# Patient Record
Sex: Female | Born: 1970
Health system: Southern US, Community
[De-identification: ages and names within clinical notes are randomized; demographics above are authoritative.]

## PROBLEM LIST (undated history)

## (undated) ENCOUNTER — Encounter

## (undated) ENCOUNTER — Ambulatory Visit

## (undated) ENCOUNTER — Encounter: Attending: Obstetrics & Gynecology | Primary: Obstetrics & Gynecology

## (undated) ENCOUNTER — Ambulatory Visit: Attending: Obstetrics & Gynecology | Primary: Obstetrics & Gynecology

## (undated) ENCOUNTER — Telehealth

## (undated) ENCOUNTER — Ambulatory Visit: Payer: PRIVATE HEALTH INSURANCE

## (undated) ENCOUNTER — Ambulatory Visit: Payer: PRIVATE HEALTH INSURANCE | Attending: Obstetrics & Gynecology | Primary: Obstetrics & Gynecology

## (undated) DIAGNOSIS — I00 Rheumatic fever without heart involvement: Secondary | ICD-10-CM

## (undated) DIAGNOSIS — I499 Cardiac arrhythmia, unspecified: Secondary | ICD-10-CM

## (undated) DIAGNOSIS — F909 Attention-deficit hyperactivity disorder, unspecified type: Secondary | ICD-10-CM

## (undated) DIAGNOSIS — Z87440 Personal history of urinary (tract) infections: Secondary | ICD-10-CM

## (undated) DIAGNOSIS — M069 Rheumatoid arthritis, unspecified: Secondary | ICD-10-CM

## (undated) DIAGNOSIS — K219 Gastro-esophageal reflux disease without esophagitis: Secondary | ICD-10-CM

## (undated) DIAGNOSIS — Z87411 Personal history of vaginal dysplasia: Secondary | ICD-10-CM

## (undated) DIAGNOSIS — Z8679 Personal history of other diseases of the circulatory system: Secondary | ICD-10-CM

## (undated) DIAGNOSIS — G8929 Other chronic pain: Secondary | ICD-10-CM

## (undated) DIAGNOSIS — N903 Dysplasia of vulva, unspecified: Secondary | ICD-10-CM

## (undated) DIAGNOSIS — R002 Palpitations: Secondary | ICD-10-CM

## (undated) DIAGNOSIS — Z8619 Personal history of other infectious and parasitic diseases: Secondary | ICD-10-CM

## (undated) DIAGNOSIS — M545 Low back pain, unspecified: Secondary | ICD-10-CM

## (undated) DIAGNOSIS — Z87410 Personal history of cervical dysplasia: Secondary | ICD-10-CM

## (undated) DIAGNOSIS — N390 Urinary tract infection, site not specified: Secondary | ICD-10-CM

## (undated) DIAGNOSIS — I10 Essential (primary) hypertension: Secondary | ICD-10-CM

## (undated) DIAGNOSIS — R072 Precordial pain: Secondary | ICD-10-CM

## (undated) DIAGNOSIS — F329 Major depressive disorder, single episode, unspecified: Secondary | ICD-10-CM

## (undated) DIAGNOSIS — Z8719 Personal history of other diseases of the digestive system: Secondary | ICD-10-CM

## (undated) HISTORY — PX: TUBAL LIGATION: SHX77

## (undated) HISTORY — PX: PLACEMENT OF BREAST IMPLANTS: SHX6334

## (undated) HISTORY — DX: Rheumatoid arthritis, unspecified: M06.9

## (undated) HISTORY — DX: Cardiac arrhythmia, unspecified: I49.9

## (undated) HISTORY — PX: DILATION AND CURETTAGE OF UTERUS: SHX78

## (undated) HISTORY — DX: Personal history of urinary (tract) infections: Z87.440

## (undated) HISTORY — DX: Essential (primary) hypertension: I10

## (undated) HISTORY — PX: APPENDECTOMY: SHX54

## (undated) MED ORDER — LEVONORGESTREL 20 MCG/24 HOURS (6 YRS) 52 MG INTRAUTERINE DEVICE: Freq: Once | INTRAUTERINE | 0 days

## (undated) MED ORDER — BARICITINIB 2 MG TABLET: Freq: Every day | ORAL | 0.00000 days

---

## 2001-09-26 HISTORY — PX: CERVICAL BIOPSY  W/ LOOP ELECTRODE EXCISION: SUR135

## 2003-04-19 ENCOUNTER — Emergency Department (HOSPITAL_COMMUNITY): Admission: EM | Admit: 2003-04-19 | Discharge: 2003-04-19 | Payer: Self-pay | Admitting: Emergency Medicine

## 2004-10-23 ENCOUNTER — Emergency Department (HOSPITAL_COMMUNITY): Admission: EM | Admit: 2004-10-23 | Discharge: 2004-10-23 | Payer: Self-pay | Admitting: Emergency Medicine

## 2006-07-05 ENCOUNTER — Emergency Department (HOSPITAL_COMMUNITY): Admission: EM | Admit: 2006-07-05 | Discharge: 2006-07-06 | Payer: Self-pay | Admitting: Emergency Medicine

## 2007-01-19 ENCOUNTER — Ambulatory Visit (HOSPITAL_COMMUNITY): Admission: RE | Admit: 2007-01-19 | Discharge: 2007-01-19 | Payer: Self-pay | Admitting: Interventional Radiology

## 2009-11-13 ENCOUNTER — Emergency Department (HOSPITAL_COMMUNITY): Admission: EM | Admit: 2009-11-13 | Discharge: 2009-11-13 | Payer: Self-pay | Admitting: Emergency Medicine

## 2010-12-16 LAB — URINALYSIS, ROUTINE W REFLEX MICROSCOPIC
Nitrite: NEGATIVE
Protein, ur: NEGATIVE mg/dL
Specific Gravity, Urine: 1.013 (ref 1.005–1.030)
Urobilinogen, UA: 0.2 mg/dL (ref 0.0–1.0)

## 2010-12-16 LAB — URINE CULTURE: Colony Count: 8000

## 2010-12-16 LAB — URINE MICROSCOPIC-ADD ON

## 2010-12-17 ENCOUNTER — Other Ambulatory Visit: Payer: Self-pay | Admitting: Family Medicine

## 2010-12-17 DIAGNOSIS — M25561 Pain in right knee: Secondary | ICD-10-CM

## 2010-12-20 ENCOUNTER — Ambulatory Visit
Admission: RE | Admit: 2010-12-20 | Discharge: 2010-12-20 | Disposition: A | Discharge status: Home / Self Care | Payer: Commercial Managed Care - PPO | Source: Ambulatory Visit | Attending: Family Medicine | Admitting: Family Medicine

## 2010-12-20 DIAGNOSIS — M25561 Pain in right knee: Secondary | ICD-10-CM

## 2011-02-22 ENCOUNTER — Other Ambulatory Visit (HOSPITAL_BASED_OUTPATIENT_CLINIC_OR_DEPARTMENT_OTHER): Payer: Self-pay | Admitting: Internal Medicine

## 2011-02-22 ENCOUNTER — Ambulatory Visit (HOSPITAL_BASED_OUTPATIENT_CLINIC_OR_DEPARTMENT_OTHER)
Admission: RE | Admit: 2011-02-22 | Discharge: 2011-02-22 | Disposition: A | Discharge status: Home / Self Care | Payer: 59 | Source: Ambulatory Visit | Attending: Internal Medicine | Admitting: Internal Medicine

## 2011-02-22 DIAGNOSIS — R509 Fever, unspecified: Secondary | ICD-10-CM | POA: Insufficient documentation

## 2011-02-22 DIAGNOSIS — R0602 Shortness of breath: Secondary | ICD-10-CM

## 2011-02-22 DIAGNOSIS — R0989 Other specified symptoms and signs involving the circulatory and respiratory systems: Secondary | ICD-10-CM | POA: Insufficient documentation

## 2011-09-27 HISTORY — PX: PLACEMENT OF BREAST IMPLANTS: SHX6334

## 2011-10-04 ENCOUNTER — Ambulatory Visit (HOSPITAL_BASED_OUTPATIENT_CLINIC_OR_DEPARTMENT_OTHER)
Admission: RE | Admit: 2011-10-04 | Discharge: 2011-10-04 | Disposition: A | Discharge status: Home / Self Care | Payer: 59 | Source: Ambulatory Visit | Attending: Internal Medicine | Admitting: Internal Medicine

## 2011-10-04 ENCOUNTER — Other Ambulatory Visit (HOSPITAL_BASED_OUTPATIENT_CLINIC_OR_DEPARTMENT_OTHER): Payer: Self-pay | Admitting: Internal Medicine

## 2011-10-04 DIAGNOSIS — R059 Cough, unspecified: Secondary | ICD-10-CM

## 2011-10-04 DIAGNOSIS — R0602 Shortness of breath: Secondary | ICD-10-CM | POA: Insufficient documentation

## 2011-10-04 DIAGNOSIS — R05 Cough: Secondary | ICD-10-CM

## 2011-10-04 DIAGNOSIS — R0989 Other specified symptoms and signs involving the circulatory and respiratory systems: Secondary | ICD-10-CM | POA: Insufficient documentation

## 2012-03-05 ENCOUNTER — Encounter (HOSPITAL_BASED_OUTPATIENT_CLINIC_OR_DEPARTMENT_OTHER): Payer: Self-pay | Admitting: *Deleted

## 2012-03-05 ENCOUNTER — Emergency Department (HOSPITAL_BASED_OUTPATIENT_CLINIC_OR_DEPARTMENT_OTHER)
Admission: EM | Admit: 2012-03-05 | Discharge: 2012-03-05 | Disposition: A | Discharge status: Home / Self Care | Payer: 59 | Attending: Emergency Medicine | Admitting: Emergency Medicine

## 2012-03-05 DIAGNOSIS — Z880 Allergy status to penicillin: Secondary | ICD-10-CM | POA: Insufficient documentation

## 2012-03-05 DIAGNOSIS — N390 Urinary tract infection, site not specified: Secondary | ICD-10-CM | POA: Insufficient documentation

## 2012-03-05 DIAGNOSIS — F17200 Nicotine dependence, unspecified, uncomplicated: Secondary | ICD-10-CM | POA: Insufficient documentation

## 2012-03-05 HISTORY — DX: Urinary tract infection, site not specified: N39.0

## 2012-03-05 HISTORY — DX: Rheumatic fever without heart involvement: I00

## 2012-03-05 LAB — URINE MICROSCOPIC-ADD ON

## 2012-03-05 LAB — URINALYSIS, ROUTINE W REFLEX MICROSCOPIC
Nitrite: NEGATIVE
Protein, ur: NEGATIVE mg/dL
Specific Gravity, Urine: 1.025 (ref 1.005–1.030)
Urobilinogen, UA: 1 mg/dL (ref 0.0–1.0)

## 2012-03-05 MED ORDER — PHENAZOPYRIDINE HCL 200 MG PO TABS: 200.0000 mg | ORAL_TABLET | Freq: Three times a day (TID) | ORAL | Status: AC

## 2012-03-05 MED ORDER — ONDANSETRON 8 MG PO TBDP: 8.0000 mg | ORAL_TABLET | Freq: Three times a day (TID) | ORAL | Status: AC | PRN

## 2012-03-05 MED ORDER — NITROFURANTOIN MONOHYD MACRO 100 MG PO CAPS: 100.0000 mg | ORAL_CAPSULE | Freq: Two times a day (BID) | ORAL | Status: AC

## 2012-03-05 NOTE — ED Notes (Signed)
Dysuria, frequency, back pain, low grade fever x 2 days. Hx of UTI's.

## 2012-03-05 NOTE — ED Provider Notes (Signed)
History     CSN: [id number]  Arrival date & time 03/05/12  1352   First MD Initiated Contact with Patient 03/05/12 1408      Chief Complaint  Patient presents with  . Urinary Tract Infection    (Consider location/radiation/quality/duration/timing/severity/associated sxs/prior treatment) HPI Comments: Patient presents with her usual urinary tract infection symptoms for 7 days consisting of dysuria , low-grade fever to 101F, lower back pain that is worse on the right, increased frequency. Patient has had nausea but no vomiting. She otherwise denies abdominal pain. The treatments prior to arrival. Course is constant, onset was gradual. Nothing makes symptoms better or worse.  Patient is a 41 y.o. female presenting with dysuria. The history is provided by the patient.  Dysuria  This is a new problem. The current episode started more than 2 days ago. The problem occurs every urination. The problem has not changed since onset.The quality of the pain is described as burning. The pain is mild. The maximum temperature recorded prior to her arrival was 101 to 101.9 F. Associated symptoms include nausea, frequency, hematuria and urgency. Pertinent negatives include no vomiting and no flank pain. She has tried nothing for the symptoms. Her past medical history is significant for recurrent UTIs.    Past Medical History  Diagnosis Date  . UTI (lower urinary tract infection)   . Rheumatic fever     Past Surgical History  Procedure Date  . Appendectomy   . Tubal ligation     No family history on file.  History  Substance Use Topics  . Smoking status: Current Everyday Smoker -- 1.0 packs/day  . Smokeless tobacco: Not on file  . Alcohol Use: Yes    OB History    Grav Para Term Preterm Abortions TAB SAB Ect Mult Living                  Review of Systems  Constitutional: Negative for fever.  HENT: Negative for facial swelling.   Gastrointestinal: Positive for nausea. Negative for  vomiting, abdominal pain and diarrhea.  Genitourinary: Positive for dysuria, urgency, frequency and hematuria. Negative for flank pain.    Allergies  Penicillins  Home Medications   Current Outpatient Rx  Name Route Sig Dispense Refill  . ADDERALL PO Oral Take by mouth.    Marland Kitchen HYDROCODONE-ACETAMINOPHEN 10-325 MG PO TABS Oral Take 1 tablet by mouth every 6 (six) hours as needed.      BP 123/82  Pulse 98  Temp(Src) 97.9 F (36.6 C) (Oral)  Resp 20  SpO2 99%  Physical Exam  Nursing note and vitals reviewed. Constitutional: She appears well-developed and well-nourished.  HENT:  Head: Normocephalic and atraumatic.  Eyes: Conjunctivae are normal.  Neck: Normal range of motion. Neck supple.  Cardiovascular: Normal rate and normal heart sounds.   Pulmonary/Chest: Effort normal and breath sounds normal. No respiratory distress. She has no wheezes.  Abdominal: There is tenderness. There is CVA tenderness (mild, R>>L). There is no rigidity, no rebound, no guarding and negative Murphy's sign.  Neurological: She is alert.  Skin: Skin is warm and dry.  Psychiatric: She has a normal mood and affect.    ED Course  Procedures (including critical care time)  Labs Reviewed  URINALYSIS, ROUTINE W REFLEX MICROSCOPIC - Abnormal; Notable for the following:    Color, Urine AMBER (*) BIOCHEMICALS MAY BE AFFECTED BY COLOR   APPearance CLOUDY (*)    Hgb urine dipstick SMALL (*)    Bilirubin Urine SMALL (*)  Ketones, ur 15 (*)    Leukocytes, UA SMALL (*)    All other components within normal limits  URINE MICROSCOPIC-ADD ON - Abnormal; Notable for the following:    Squamous Epithelial / LPF FEW (*)    Bacteria, UA MANY (*)    All other components within normal limits  URINE CULTURE   No results found.   1. Urinary tract infection     2:45 PM Patient seen and examined. UA reviewed. Will treat for UTI. Will give zofran for nausea. Urged return if no improvement.   Vital signs  reviewed and are as follows: Filed Vitals:   03/05/12 1356  BP: 123/82  Pulse: 98  Temp: 97.9 F (36.6 C)  Resp: 20   Patient urged to return to the emergency department with persistent fever, persistent vomiting, worsening pain, or any other emergent concerns. Patient verbalizes understanding and agrees with the plan.   MDM  Patient presents with symptoms consistent with previous urinary tract infections. Do not suspect pyelonephritis at this time. Patient is tolerating PO's. UA is suggestive of a urinary tract infection however it is not a clean catch. Given the patient's history of recurrent UTIs will check culture.        Renne Crigler, [place] 03/05/12 1526

## 2012-03-05 NOTE — Discharge Instructions (Signed)
Please read and follow all provided instructions.  Your diagnoses today include:  1. Urinary tract infection     Tests performed today include:  Urine test which suggests an infection in your urine  Vital signs. See below for your results today.   Medications prescribed:   Macrobid - antibiotic that kills urine infection  You have been prescribed an antibiotic medicine: take the entire course of medicine even if you are feeling better. Stopping early can cause the antibiotic not to work.   Zofran (ondansetron) - for nausea and vomiting  Take any prescribed medications only as directed.  Home care instructions:  Follow any educational materials contained in this packet.  Follow-up instructions: Please follow-up with your primary care provider in the next 1 week for further evaluation of your symptoms. If you do not have a primary care doctor -- see below for referral information.   Return instructions:   Please return to the Emergency Department if you experience worsening symptoms.   Please return if you have any other emergent concerns.  Additional Information:  Your vital signs today were: BP 123/82  Pulse 98  Temp(Src) 97.9 F (36.6 C) (Oral)  Resp 20  SpO2 99% If your blood pressure (BP) was elevated above 135/85 this visit, please have this repeated by your doctor within one month. -------------- No Primary Care Doctor Call [facility]  [phone] Other agencies that provide inexpensive medical care    [facility]  [phone]    [facility]  [phone]    [facility]  [phone]    [facility]  [phone]    [facility]  [phone]    [facility]  [phone] -------------- RESOURCE GUIDE:  Dental Problems  Patients with Medicaid: [facility] [phone] [address]                                            [id number] Phone:  [phone]                                                    Phone:  [phone]  If unable to pay or uninsured, contact:  [facility] or [facility]. to become qualified for the adult dental clinic.  Chronic Pain Problems Contact [facility]  [phone] Patients need to be referred by their primary care doctor.  Insufficient Money for Medicine Contact [employer]:  call "211" or [facility] [phone].  Psychological Services [facility]  [phone] [facility]  [phone] [facility]   [phone] (emergency services [phone])  Substance Abuse Resources [facility]  [phone] [facility] [phone] [facility] [phone] Floydene Flock [phone] Residential & Outpatient Substance Abuse Program  [phone]  Abuse/Neglect [facility] Child Abuse Hotline [phone] [facility] Child Abuse Hotline [phone] (After Hours)  Emergency Shelter [facility] [phone]  Maternity Homes [facility] [phone] [facility] Services [phone]  [facility]  [place]     [employer]                          [facility] Dept. [address]                        [address]      [address]  Blondell Reveal Phone:  [phone]                                   Phone:  [phone]                 Phone:  [phone]  [facility] Phone:  [phone]  [facility] Child Abuse Hotline [phone] [phone] (After Hours)

## 2012-03-06 LAB — URINE CULTURE
Colony Count: 30000
Culture  Setup Time: 201306102251

## 2012-03-08 NOTE — ED Provider Notes (Signed)
Medical screening examination/treatment/procedure(s) were performed by non-physician practitioner and as supervising physician I was immediately available for consultation/collaboration.   Nat Christen, MD 03/08/12 1901

## 2012-04-24 ENCOUNTER — Emergency Department (HOSPITAL_COMMUNITY): Payer: PRIVATE HEALTH INSURANCE

## 2012-04-24 ENCOUNTER — Emergency Department (HOSPITAL_COMMUNITY)
Admission: EM | Admit: 2012-04-24 | Discharge: 2012-04-24 | Disposition: A | Discharge status: Home / Self Care | Payer: PRIVATE HEALTH INSURANCE | Attending: Emergency Medicine | Admitting: Emergency Medicine

## 2012-04-24 ENCOUNTER — Encounter (HOSPITAL_COMMUNITY): Payer: Self-pay | Admitting: Emergency Medicine

## 2012-04-24 DIAGNOSIS — Y9210 Unspecified residential institution as the place of occurrence of the external cause: Secondary | ICD-10-CM | POA: Insufficient documentation

## 2012-04-24 DIAGNOSIS — F17200 Nicotine dependence, unspecified, uncomplicated: Secondary | ICD-10-CM | POA: Insufficient documentation

## 2012-04-24 DIAGNOSIS — S63609A Unspecified sprain of unspecified thumb, initial encounter: Secondary | ICD-10-CM

## 2012-04-24 DIAGNOSIS — W230XXA Caught, crushed, jammed, or pinched between moving objects, initial encounter: Secondary | ICD-10-CM | POA: Insufficient documentation

## 2012-04-24 DIAGNOSIS — Z880 Allergy status to penicillin: Secondary | ICD-10-CM | POA: Insufficient documentation

## 2012-04-24 DIAGNOSIS — IMO0002 Reserved for concepts with insufficient information to code with codable children: Secondary | ICD-10-CM | POA: Insufficient documentation

## 2012-04-24 MED ORDER — IBUPROFEN 800 MG PO TABS
800.0000 mg | ORAL_TABLET | Freq: Once | ORAL | Status: AC
  Administered 2012-04-24: 800 mg via ORAL
  Filled 2012-04-24: qty 1

## 2012-04-24 MED ORDER — HYDROCODONE-ACETAMINOPHEN 5-325 MG PO TABS: 2.0000 | ORAL_TABLET | ORAL | Status: AC | PRN

## 2012-04-24 NOTE — ED Provider Notes (Signed)
History     CSN: [id number]  Arrival date & time 04/24/12  0451   First MD Initiated Contact with Patient 04/24/12 [phone]      Chief Complaint  Patient presents with  . Hand Injury  . 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 Pain aggravated by movement. Took nothing for pain PTA. Denies numbness or tingling. Denies additional injury.   Past Medical History  Diagnosis Date  . UTI (lower urinary tract infection)   . Rheumatic fever     Past Surgical History  Procedure Date  . Appendectomy   . Tubal ligation     History reviewed. No pertinent family history.  History  Substance Use Topics  . Smoking status: Current Everyday Smoker -- 1.0 packs/day  . Smokeless tobacco: Not on file  . Alcohol Use: Yes    OB History    Grav Para Term Preterm Abortions TAB SAB Ect Mult Living                  Review of Systems  Musculoskeletal: Positive for arthralgias.  Skin: Negative for color change and wound.  Neurological: Negative for numbness.    Allergies  Penicillins  Home Medications   Current Outpatient Rx  Name Route Sig Dispense Refill  . ADDERALL PO Oral Take 1 tablet by mouth daily.     Marland Kitchen HYDROCODONE-ACETAMINOPHEN 10-325 MG PO TABS Oral Take 1 tablet by mouth every 6 (six) hours as needed. For pain      BP 129/92  Pulse 105  Temp 98 F (36.7 C) (Oral)  Resp 18  SpO2 99%  Physical Exam  Constitutional: She is oriented to person, place, and time. She appears well-developed and well-nourished. No distress.  HENT:  Head: Normocephalic and atraumatic.  Eyes: Conjunctivae are normal.  Cardiovascular: Normal rate and regular  rhythm.   Pulmonary/Chest: Effort normal.  Musculoskeletal: She exhibits edema and tenderness.       Right ankle: She exhibits swelling. tenderness.       Faint bruising with soft tissue swelling and TTP at base of right thumb and into snuff box. FROM of thumb and wrist with pain at snuff box with ROM. Good radial pulse, cap refill and sensation of thumb. No TTP into forearm.    Neurological: She is alert and oriented to person, place, and time.       Normal sensation of entire foot.   Skin: Skin is warm and dry. No rash noted. She is not diaphoretic. No erythema. No pallor.  Psychiatric: She has a normal mood and affect. Her behavior is normal.    ED Course  Procedures (including critical care time)  PO ibuprofen and ice to hand.   Labs Reviewed - No data to display No results found.   1. Sprain of thumb     Xray reviewed by Dr. Norlene Campbell and myself with no obvious boney abnormality.   MDM  Snuff box TTP in neurovasc intact right hand. Will thumb spica and given hand follow up for recheck in one week.         [place], [place] 04/24/12 [phone]

## 2012-04-24 NOTE — ED Provider Notes (Signed)
Medical screening examination/treatment/procedure(s) were performed by non-physician practitioner and as supervising physician I was immediately available for consultation/collaboration.  Olivia Mackie, MD 04/24/12 [phone]

## 2012-04-24 NOTE — ED Notes (Signed)
Ortho paged. 

## 2012-04-24 NOTE — ED Notes (Signed)
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

## 2012-04-24 NOTE — ED Notes (Signed)
Pt back from X-ray.  Post R hand with bruising.  No deformity, though mild edema.  Pt states pain increases with opening and closing of hand.  Ice given to pt.

## 2012-08-21 ENCOUNTER — Other Ambulatory Visit (HOSPITAL_COMMUNITY)
Admission: RE | Admit: 2012-08-21 | Discharge: 2012-08-21 | Disposition: A | Discharge status: Home / Self Care | Payer: 59 | Source: Ambulatory Visit | Attending: Obstetrics and Gynecology | Admitting: Obstetrics and Gynecology

## 2012-08-21 DIAGNOSIS — Z124 Encounter for screening for malignant neoplasm of cervix: Secondary | ICD-10-CM | POA: Insufficient documentation

## 2012-12-21 ENCOUNTER — Encounter (HOSPITAL_COMMUNITY): Payer: Self-pay | Admitting: *Deleted

## 2012-12-21 ENCOUNTER — Emergency Department (HOSPITAL_COMMUNITY)
Admission: EM | Admit: 2012-12-21 | Discharge: 2012-12-21 | Disposition: A | Discharge status: Home / Self Care | Payer: 59 | Attending: Emergency Medicine | Admitting: Emergency Medicine

## 2012-12-21 DIAGNOSIS — R1110 Vomiting, unspecified: Secondary | ICD-10-CM | POA: Insufficient documentation

## 2012-12-21 DIAGNOSIS — R6883 Chills (without fever): Secondary | ICD-10-CM | POA: Insufficient documentation

## 2012-12-21 DIAGNOSIS — R300 Dysuria: Secondary | ICD-10-CM | POA: Insufficient documentation

## 2012-12-21 DIAGNOSIS — F17200 Nicotine dependence, unspecified, uncomplicated: Secondary | ICD-10-CM | POA: Insufficient documentation

## 2012-12-21 DIAGNOSIS — Z3202 Encounter for pregnancy test, result negative: Secondary | ICD-10-CM | POA: Insufficient documentation

## 2012-12-21 DIAGNOSIS — N3941 Urge incontinence: Secondary | ICD-10-CM | POA: Insufficient documentation

## 2012-12-21 DIAGNOSIS — Z87440 Personal history of urinary (tract) infections: Secondary | ICD-10-CM | POA: Insufficient documentation

## 2012-12-21 DIAGNOSIS — Z79899 Other long term (current) drug therapy: Secondary | ICD-10-CM | POA: Insufficient documentation

## 2012-12-21 DIAGNOSIS — R350 Frequency of micturition: Secondary | ICD-10-CM | POA: Insufficient documentation

## 2012-12-21 DIAGNOSIS — Z8679 Personal history of other diseases of the circulatory system: Secondary | ICD-10-CM | POA: Insufficient documentation

## 2012-12-21 DIAGNOSIS — N10 Acute tubulo-interstitial nephritis: Secondary | ICD-10-CM | POA: Insufficient documentation

## 2012-12-21 LAB — URINALYSIS, ROUTINE W REFLEX MICROSCOPIC
Glucose, UA: NEGATIVE mg/dL
Ketones, ur: 15 mg/dL — AB
Protein, ur: NEGATIVE mg/dL
Urobilinogen, UA: 1 mg/dL (ref 0.0–1.0)

## 2012-12-21 LAB — COMPREHENSIVE METABOLIC PANEL
ALT: 18 U/L (ref 0–35)
Alkaline Phosphatase: 101 U/L (ref 39–117)
BUN: 12 mg/dL (ref 6–23)
Calcium: 9.4 mg/dL (ref 8.4–10.5)
Chloride: 98 mEq/L (ref 96–112)
Creatinine, Ser: 0.75 mg/dL (ref 0.50–1.10)
GFR calc Af Amer: >90 mL/min (ref >90)

## 2012-12-21 LAB — URINE MICROSCOPIC-ADD ON

## 2012-12-21 LAB — CBC WITH DIFFERENTIAL/PLATELET
Basophils Absolute: 0 10*3/uL (ref 0.0–0.1)
Basophils Relative: 0 % (ref 0–1)
Eosinophils Relative: 1 % (ref 0–5)
HCT: 41.5 % (ref 36.0–46.0)
Hemoglobin: 14.5 g/dL (ref 12.0–15.0)
MCH: 31.7 pg (ref 26.0–34.0)
MCHC: 34.9 g/dL (ref 30.0–36.0)
MCV: 90.6 fL (ref 78.0–100.0)
Monocytes Absolute: 1.7 10*3/uL — ABNORMAL HIGH (ref 0.1–1.0)
Monocytes Relative: 8 % (ref 3–12)
Neutro Abs: 17.2 10*3/uL — ABNORMAL HIGH (ref 1.7–7.7)
RDW: 14.8 % (ref 11.5–15.5)

## 2012-12-21 LAB — PREGNANCY, URINE: Preg Test, Ur: NEGATIVE

## 2012-12-21 MED ORDER — ACETAMINOPHEN 325 MG PO TABS
975.0000 mg | ORAL_TABLET | Freq: Once | ORAL | Status: AC
  Administered 2012-12-21: 975 mg via ORAL
  Filled 2012-12-21: qty 3

## 2012-12-21 MED ORDER — SODIUM CHLORIDE 0.9 % IV BOLUS (SEPSIS)
1000.0000 mL | Freq: Once | INTRAVENOUS | Status: AC
  Administered 2012-12-21: 1000 mL via INTRAVENOUS

## 2012-12-21 MED ORDER — CEFPODOXIME PROXETIL 200 MG PO TABS: 200.0000 mg | ORAL_TABLET | Freq: Two times a day (BID) | ORAL | Status: DC

## 2012-12-21 MED ORDER — ONDANSETRON HCL 4 MG PO TABS: 4.0000 mg | ORAL_TABLET | Freq: Four times a day (QID) | ORAL | Status: DC

## 2012-12-21 MED ORDER — DEXTROSE 5 % IV SOLN
1.0000 g | Freq: Once | INTRAVENOUS | Status: AC
  Administered 2012-12-21: 1 g via INTRAVENOUS
  Filled 2012-12-21: qty 10

## 2012-12-21 NOTE — ED Provider Notes (Signed)
History     CSN: [id number]  Arrival date & time 12/21/12  1916   First MD Initiated Contact with Patient 12/21/12 2134      Chief Complaint  Patient presents with  . Flank Pain    (Consider location/radiation/quality/duration/timing/severity/associated sxs/prior treatment) Patient is a 42 y.o. female presenting with dysuria.  Dysuria  This is a new problem. Episode onset: 5 days ago. The problem occurs every urination. The problem has been gradually worsening. Quality: "razor blades" The pain is severe. The maximum temperature recorded prior to her arrival was 101 to 101.9 F. There is no history of pyelonephritis. Associated symptoms include chills, sweats, vomiting, frequency, urgency and flank pain. Pertinent negatives include no nausea. Treatments tried: tylenol. Her past medical history is significant for recurrent UTIs.    Past Medical History  Diagnosis Date  . UTI (lower urinary tract infection)   . Rheumatic fever     Past Surgical History  Procedure Laterality Date  . Appendectomy    . Tubal ligation      No family history on file.  History  Substance Use Topics  . Smoking status: Current Every Day Smoker -- 1.00 packs/day  . Smokeless tobacco: Not on file  . Alcohol Use: Yes    OB History   Grav Para Term Preterm Abortions TAB SAB Ect Mult Living                  Review of Systems  Constitutional: Positive for chills. Negative for fever.  HENT: Negative for congestion.   Respiratory: Negative for cough and shortness of breath.   Cardiovascular: Negative for chest pain.  Gastrointestinal: Positive for vomiting. Negative for nausea, abdominal pain and diarrhea.  Genitourinary: Positive for dysuria, urgency, frequency and flank pain. Negative for difficulty urinating.  All other systems reviewed and are negative.    Allergies  Penicillins  Home Medications   Current Outpatient Rx  Name  Route  Sig  Dispense  Refill  .  Amphetamine-Dextroamphetamine (ADDERALL PO)   Oral   Take 1 tablet by mouth daily.          Marland Kitchen HYDROcodone-acetaminophen (NORCO) 10-325 MG per tablet   Oral   Take 1 tablet by mouth every 6 (six) hours as needed. For pain           BP 125/69  Pulse 114  Temp(Src) 98.4 F (36.9 C) (Oral)  Resp 18  SpO2 96%  Physical Exam  Nursing note and vitals reviewed. Constitutional: She is oriented to person, place, and time. She appears well-developed and well-nourished. No distress.  HENT:  Head: Normocephalic and atraumatic.  Mouth/Throat: Oropharynx is clear and moist.  Eyes: Conjunctivae are normal. Pupils are equal, round, and reactive to light. No scleral icterus.  Neck: Neck supple.  Cardiovascular: Normal rate, regular rhythm, normal heart sounds and intact distal pulses.   No murmur heard. Pulmonary/Chest: Effort normal and breath sounds normal. No stridor. No respiratory distress. She has no rales.  Abdominal: Soft. Bowel sounds are normal. She exhibits no distension. There is no tenderness. There is CVA tenderness (left). There is no rigidity, no rebound and no guarding.  Musculoskeletal: Normal range of motion.  Neurological: She is alert and oriented to person, place, and time.  Skin: Skin is warm and dry. No rash noted.  Psychiatric: She has a normal mood and affect. Her behavior is normal.    ED Course  Procedures (including critical care time)  Labs Reviewed  URINALYSIS, ROUTINE W REFLEX  MICROSCOPIC - Abnormal; Notable for the following:    Color, Urine ORANGE (*)    APPearance HAZY (*)    Hgb urine dipstick LARGE (*)    Bilirubin Urine SMALL (*)    Ketones, ur 15 (*)    Nitrite POSITIVE (*)    Leukocytes, UA LARGE (*)    All other components within normal limits  CBC WITH DIFFERENTIAL - Abnormal; Notable for the following:    WBC 21.3 (*)    Neutrophils Relative 81 (*)    Neutro Abs 17.2 (*)    Lymphocytes Relative 11 (*)    Monocytes Absolute 1.7 (*)     All other components within normal limits  COMPREHENSIVE METABOLIC PANEL - Abnormal; Notable for the following:    Glucose, Bld 136 (*)    All other components within normal limits  URINE MICROSCOPIC-ADD ON - Abnormal; Notable for the following:    Squamous Epithelial / LPF MANY (*)    Bacteria, UA MANY (*)    All other components within normal limits  URINE CULTURE  PREGNANCY, URINE   No results found.   1. Acute pyelonephritis       MDM   42 yo female with hx of recurrent UTIs with fevers, urinary symptoms, and left flank pain.  Clinical picture c/w pyelonephritis.  Tachycardic on my exam.  Plan IV fluids and IV ceftriaxone.  Anticipate ability to discharge for outpt treatment given her overall well appearance.   11:05 PM HR in mid 90's.  Pt feels much better.  Plan to dc home with PCP followup.       Rennis Petty, MD 12/22/12 [phone]

## 2012-12-21 NOTE — ED Provider Notes (Signed)
42 year old female states that she has not felt well for the last 5 or 6 days. Yesterday, she developed pain in her left flank. Last night, she started having fever, chills. She's had dysuria and took over-the-counter Azo to try and help with those symptoms. This morning, she did vomit once. She states she was hoping to be able to hold off until she could see her PCP in 3 days. On exam, she is awake, alert, and nontoxic in appearance. Lungs are clear. Heart is mildly tachycardic. He's been given IV fluids. There is left CVA tenderness. Abdomen is soft and nontender. Urinalysis does show evidence of UTI. WBC is markedly elevated at 21,000 but, as noted, patient does not appear toxic in any way. She is given a dose of Rocephin in the ED and she looks like she should be able to go home with oral antibiotics.  I saw and evaluated the patient, reviewed the resident's note and I agree with the findings and plan.   Dione Booze, MD 12/21/12 2217

## 2012-12-21 NOTE — ED Notes (Signed)
The pt has had lt flank pain since yesterday with a temp and painful urination.  lmp years iud

## 2012-12-23 LAB — URINE CULTURE

## 2013-04-03 ENCOUNTER — Ambulatory Visit (HOSPITAL_BASED_OUTPATIENT_CLINIC_OR_DEPARTMENT_OTHER)
Admission: RE | Admit: 2013-04-03 | Discharge: 2013-04-03 | Disposition: A | Discharge status: Home / Self Care | Payer: 59 | Source: Ambulatory Visit | Attending: Internal Medicine | Admitting: Internal Medicine

## 2013-04-03 ENCOUNTER — Other Ambulatory Visit (HOSPITAL_BASED_OUTPATIENT_CLINIC_OR_DEPARTMENT_OTHER): Payer: Self-pay | Admitting: Internal Medicine

## 2013-04-03 DIAGNOSIS — S99921A Unspecified injury of right foot, initial encounter: Secondary | ICD-10-CM

## 2013-04-03 DIAGNOSIS — S99919A Unspecified injury of unspecified ankle, initial encounter: Secondary | ICD-10-CM | POA: Insufficient documentation

## 2013-04-03 DIAGNOSIS — X58XXXA Exposure to other specified factors, initial encounter: Secondary | ICD-10-CM | POA: Insufficient documentation

## 2013-04-03 DIAGNOSIS — S8990XA Unspecified injury of unspecified lower leg, initial encounter: Secondary | ICD-10-CM | POA: Insufficient documentation

## 2013-04-03 DIAGNOSIS — M7730 Calcaneal spur, unspecified foot: Secondary | ICD-10-CM | POA: Insufficient documentation

## 2013-10-10 ENCOUNTER — Ambulatory Visit (INDEPENDENT_AMBULATORY_CARE_PROVIDER_SITE_OTHER): Payer: 59 | Admitting: Podiatry

## 2013-10-10 ENCOUNTER — Ambulatory Visit: Payer: Self-pay | Admitting: Podiatry

## 2013-10-10 ENCOUNTER — Encounter: Payer: Self-pay | Admitting: Podiatry

## 2013-10-10 ENCOUNTER — Ambulatory Visit (INDEPENDENT_AMBULATORY_CARE_PROVIDER_SITE_OTHER): Payer: 59

## 2013-10-10 VITALS — BP 153/96 | HR 100 | Resp 12 | Ht 68.0 in | Wt 206.0 lb

## 2013-10-10 DIAGNOSIS — R52 Pain, unspecified: Secondary | ICD-10-CM

## 2013-10-10 DIAGNOSIS — M722 Plantar fascial fibromatosis: Secondary | ICD-10-CM

## 2013-10-10 MED ORDER — METHYLPREDNISOLONE (PAK) 4 MG PO TABS: ORAL_TABLET | ORAL | Status: DC

## 2013-10-10 MED ORDER — MELOXICAM 15 MG PO TABS: 15.0000 mg | ORAL_TABLET | Freq: Every day | ORAL | Status: DC

## 2013-10-10 NOTE — Patient Instructions (Signed)
Plantar Fasciitis (Heel Spur Syndrome) with Rehab The plantar fascia is a fibrous, ligament-like, soft-tissue structure that spans the bottom of the foot. Plantar fasciitis is a condition that causes pain in the foot due to inflammation of the tissue. SYMPTOMS   Pain and tenderness on the underneath side of the foot.  Pain that worsens with standing or walking. CAUSES  Plantar fasciitis is caused by irritation and injury to the plantar fascia on the underneath side of the foot. Common mechanisms of injury include:  Direct trauma to bottom of the foot.  Damage to a small nerve that runs under the foot where the main fascia attaches to the heel bone.  Stress placed on the plantar fascia due to bone spurs. RISK INCREASES WITH:   Activities that place stress on the plantar fascia (running, jumping, pivoting, or cutting).  Poor strength and flexibility.  Improperly fitted shoes.  Tight calf muscles.  Flat feet.  Failure to warm-up properly before activity.  Obesity. PREVENTION  Warm up and stretch properly before activity.  Allow for adequate recovery between workouts.  Maintain physical fitness:  Strength, flexibility, and endurance.  Cardiovascular fitness.  Maintain a health body weight.  Avoid stress on the plantar fascia.  Wear properly fitted shoes, including arch supports for individuals who have flat feet. PROGNOSIS  If treated properly, then the symptoms of plantar fasciitis usually resolve without surgery. However, occasionally surgery is necessary. RELATED COMPLICATIONS   Recurrent symptoms that may result in a chronic condition.  Problems of the lower back that are caused by compensating for the injury, such as limping.  Pain or weakness of the foot during push-off following surgery.  Chronic inflammation, scarring, and partial or complete fascia tear, occurring more often from repeated injections. TREATMENT  Treatment initially involves the use of  ice and medication to help reduce pain and inflammation. The use of strengthening and stretching exercises may help reduce pain with activity, especially stretches of the Achilles tendon. These exercises may be performed at home or with a therapist. Your caregiver may recommend that you use heel cups of arch supports to help reduce stress on the plantar fascia. Occasionally, corticosteroid injections are given to reduce inflammation. If symptoms persist for greater than 6 months despite non-surgical (conservative), then surgery may be recommended.  MEDICATION   If pain medication is necessary, then nonsteroidal anti-inflammatory medications, such as aspirin and ibuprofen, or other minor pain relievers, such as acetaminophen, are often recommended.  Do not take pain medication within 7 days before surgery.  Prescription pain relievers may be given if deemed necessary by your caregiver. Use only as directed and only as much as you need.  Corticosteroid injections may be given by your caregiver. These injections should be reserved for the most serious cases, because they may only be given a certain number of times. HEAT AND COLD  Cold treatment (icing) relieves pain and reduces inflammation. Cold treatment should be applied for 10 to 15 minutes every 2 to 3 hours for inflammation and pain and immediately after any activity that aggravates your symptoms. Use ice packs or massage the area with a piece of ice (ice massage).  Heat treatment may be used prior to performing the stretching and strengthening activities prescribed by your caregiver, physical therapist, or athletic trainer. Use a heat pack or soak the injury in warm water. SEEK IMMEDIATE MEDICAL CARE IF:  Treatment seems to offer no benefit, or the condition worsens.  Any medications produce adverse side effects. EXERCISES RANGE   OF MOTION (ROM) AND STRETCHING EXERCISES - Plantar Fasciitis (Heel Spur Syndrome) These exercises may help you  when beginning to rehabilitate your injury. Your symptoms may resolve with or without further involvement from your physician, physical therapist or athletic trainer. While completing these exercises, remember:   Restoring tissue flexibility helps normal motion to return to the joints. This allows healthier, less painful movement and activity.  An effective stretch should be held for at least 30 seconds.  A stretch should never be painful. You should only feel a gentle lengthening or release in the stretched tissue. RANGE OF MOTION - Toe Extension, Flexion  Sit with your right / left leg crossed over your opposite knee.  Grasp your toes and gently pull them back toward the top of your foot. You should feel a stretch on the bottom of your toes and/or foot.  Hold this stretch for __________ seconds.  Now, gently pull your toes toward the bottom of your foot. You should feel a stretch on the top of your toes and or foot.  Hold this stretch for __________ seconds. Repeat __________ times. Complete this stretch __________ times per day.  RANGE OF MOTION - Ankle Dorsiflexion, Active Assisted  Remove shoes and sit on a chair that is preferably not on a carpeted surface.  Place right / left foot under knee. Extend your opposite leg for support.  Keeping your heel down, slide your right / left foot back toward the chair until you feel a stretch at your ankle or calf. If you do not feel a stretch, slide your bottom forward to the edge of the chair, while still keeping your heel down.  Hold this stretch for __________ seconds. Repeat __________ times. Complete this stretch __________ times per day.  STRETCH  Gastroc, Standing  Place hands on wall.  Extend right / left leg, keeping the front knee somewhat bent.  Slightly point your toes inward on your back foot.  Keeping your right / left heel on the floor and your knee straight, shift your weight toward the wall, not allowing your back to  arch.  You should feel a gentle stretch in the right / left calf. Hold this position for __________ seconds. Repeat __________ times. Complete this stretch __________ times per day. STRETCH  Soleus, Standing  Place hands on wall.  Extend right / left leg, keeping the other knee somewhat bent.  Slightly point your toes inward on your back foot.  Keep your right / left heel on the floor, bend your back knee, and slightly shift your weight over the back leg so that you feel a gentle stretch deep in your back calf.  Hold this position for __________ seconds. Repeat __________ times. Complete this stretch __________ times per day. STRETCH  Gastrocsoleus, Standing  Note: This exercise can place a lot of stress on your foot and ankle. Please complete this exercise only if specifically instructed by your caregiver.   Place the ball of your right / left foot on a step, keeping your other foot firmly on the same step.  Hold on to the wall or a rail for balance.  Slowly lift your other foot, allowing your body weight to press your heel down over the edge of the step.  You should feel a stretch in your right / left calf.  Hold this position for __________ seconds.  Repeat this exercise with a slight bend in your right / left knee. Repeat __________ times. Complete this stretch __________ times per day.    STRENGTHENING EXERCISES - Plantar Fasciitis (Heel Spur Syndrome)  These exercises may help you when beginning to rehabilitate your injury. They may resolve your symptoms with or without further involvement from your physician, physical therapist or athletic trainer. While completing these exercises, remember:   Muscles can gain both the endurance and the strength needed for everyday activities through controlled exercises.  Complete these exercises as instructed by your physician, physical therapist or athletic trainer. Progress the resistance and repetitions only as guided. STRENGTH - Towel  Curls  Sit in a chair positioned on a non-carpeted surface.  Place your foot on a towel, keeping your heel on the floor.  Pull the towel toward your heel by only curling your toes. Keep your heel on the floor.  If instructed by your physician, physical therapist or athletic trainer, add ____________________ at the end of the towel. Repeat __________ times. Complete this exercise __________ times per day. STRENGTH - Ankle Inversion  Secure one end of a rubber exercise band/tubing to a fixed object (table, pole). Loop the other end around your foot just before your toes.  Place your fists between your knees. This will focus your strengthening at your ankle.  Slowly, pull your big toe up and in, making sure the band/tubing is positioned to resist the entire motion.  Hold this position for __________ seconds.  Have your muscles resist the band/tubing as it slowly pulls your foot back to the starting position. Repeat __________ times. Complete this exercises __________ times per day.  Document Released: 09/12/2005 Document Revised: 12/05/2011 Document Reviewed: 12/25/2008 ExitCare Patient Information 2014 [employer]. Plantar Fasciitis Plantar fasciitis is a common condition that causes foot pain. It is soreness (inflammation) of the band of tough fibrous tissue on the bottom of the foot that runs from the heel bone (calcaneus) to the ball of the foot. The cause of this soreness may be from excessive standing, poor fitting shoes, running on hard surfaces, being overweight, having an abnormal walk, or overuse (this is common in runners) of the painful foot or feet. It is also common in aerobic exercise dancers and ballet dancers. SYMPTOMS  Most people with plantar fasciitis complain of:  Severe pain in the morning on the bottom of their foot especially when taking the first steps out of bed. This pain recedes after a few minutes of walking.  Severe pain is experienced also during walking  following a long period of inactivity.  Pain is worse when walking barefoot or up stairs DIAGNOSIS   Your caregiver will diagnose this condition by examining and feeling your foot.  Special tests such as X-rays of your foot, are usually not needed. PREVENTION   Consult a sports medicine professional before beginning a new exercise program.  Walking programs offer a good workout. With walking there is a lower chance of overuse injuries common to runners. There is less impact and less jarring of the joints.  Begin all new exercise programs slowly. If problems or pain develop, decrease the amount of time or distance until you are at a comfortable level.  Wear good shoes and replace them regularly.  Stretch your foot and the heel cords at the back of the ankle (Achilles tendon) both before and after exercise.  Run or exercise on even surfaces that are not hard. For example, asphalt is better than pavement.  Do not run barefoot on hard surfaces.  If using a treadmill, vary the incline.  Do not continue to workout if you have foot or joint   problems. Seek professional help if they do not improve. HOME CARE INSTRUCTIONS   Avoid activities that cause you pain until you recover.  Use ice or cold packs on the problem or painful areas after working out.  Only take over-the-counter or prescription medicines for pain, discomfort, or fever as directed by your caregiver.  Soft shoe inserts or athletic shoes with air or gel sole cushions may be helpful.  If problems continue or become more severe, consult a sports medicine caregiver or your own health care provider. Cortisone is a potent anti-inflammatory medication that may be injected into the painful area. You can discuss this treatment with your caregiver. MAKE SURE YOU:   Understand these instructions.  Will watch your condition.  Will get help right away if you are not doing well or get worse. Document Released: 06/07/2001 Document  Revised: 12/05/2011 Document Reviewed: 08/06/2008 ExitCare Patient Information 2014 [employer].  

## 2013-10-10 NOTE — Progress Notes (Signed)
   Subjective:    Patient ID: Briana Jackson, female    DOB: December 09, 1970, 43 y.o.   MRN: [id number]  HPI Comments: '' RT FOOT HEEL IS SORE FOR 6 MONTHS AND GOTTEN WORSE FOR 1 1/2 MONTH. [facility]  PUT ME IN A BOOT, ICING, AND PAIN MEDICATION.''     Review of Systems  HENT: Positive for sinus pressure.   All other systems reviewed and are negative.       Objective:   Physical Exam: Vital signs are stable she is alert and oriented x3. I have reviewed her past medical history medications allergies surgeries social history. Pulses are strongly palpable right lower extremity. Neurologic sensorium is intact. Deep tendon reflexes are intact bilateral. Muscle strength was 5 over 5 dorsiflexors plantar flexors inverters everters all intrinsic musculature is intact. Orthopedic evaluation demonstrates all joints distal to the ankle a full range of motion without crepitation. No pain on medial lateral compression of the calcaneus. She does have pain on direct palpation of the medial calcaneal tubercle of the right heel at the plantar fascial calcaneal insertion site. Radiographic evaluation of the right foot does demonstrate a soft tissue increase in density at the plantar fascial calcaneal insertion site of the right heel. Cutaneous evaluation demonstrates supple well hydrated cutis no erythema edema cellulitis drainage or odor.        Assessment & Plan:  Assessment: Plantar fasciitis right heel.  Plan: Discussed etiology pathology conservative versus surgical therapies. At this point we initiated treatment plan with an injection of Kenalog and local anesthetic to the plantar fascial calcaneal insertion site of the right heel. Placed her in a plantar fascial strapping. She was dispensed a night splint. She was also provided with a prescription for Medrol Dosepak to be followed by Mobic. She was given both oral and written home-going instructions for stretching we discussed ice therapy shoe gear  modification and stretching exercises. I will followup with her in one month

## 2013-11-07 ENCOUNTER — Encounter: Payer: Self-pay | Admitting: Podiatry

## 2013-11-07 ENCOUNTER — Ambulatory Visit (INDEPENDENT_AMBULATORY_CARE_PROVIDER_SITE_OTHER): Payer: 59 | Admitting: Podiatry

## 2013-11-07 VITALS — BP 126/80 | HR 100 | Resp 12

## 2013-11-07 DIAGNOSIS — M722 Plantar fascial fibromatosis: Secondary | ICD-10-CM

## 2013-11-07 NOTE — Progress Notes (Signed)
She presents today for followup of plantar fasciitis to her left foot she states it is approximately 40-50% better.  Objective: Vital signs are stable alert and oriented x3. Pain on palpation medial calcaneal tubercle right foot.  Assessment: Plantar fasciitis right.  Plan: Injection right heel today plantar fascial strapping.

## 2013-12-03 ENCOUNTER — Encounter: Payer: 59 | Admitting: Podiatry

## 2013-12-17 ENCOUNTER — Encounter: Payer: Self-pay | Admitting: Podiatry

## 2013-12-17 ENCOUNTER — Ambulatory Visit (INDEPENDENT_AMBULATORY_CARE_PROVIDER_SITE_OTHER): Payer: 59 | Admitting: Podiatry

## 2013-12-17 VITALS — BP 125/75 | HR 100 | Resp 18 | Ht 70.0 in | Wt 200.0 lb

## 2013-12-17 DIAGNOSIS — M722 Plantar fascial fibromatosis: Secondary | ICD-10-CM

## 2013-12-17 NOTE — Progress Notes (Signed)
Pt states foot is painful and now swollen at the right medial inferior arch..  Objective: Vital signs are stable she is alert and oriented x3. Pulses are palpable right. Moderate edema overlying the medial and plantar medial aspect of the right calcaneus. She has pain on palpation medial continued tubercle of the right heel.  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  Plan: Discussed etiology pathology conservative versus surgical therapies at this point I reinjected her right heel and had her scan for orthotics I will followup with her once his orthotics come in.

## 2013-12-30 ENCOUNTER — Other Ambulatory Visit: Payer: 59

## 2014-01-03 ENCOUNTER — Ambulatory Visit (INDEPENDENT_AMBULATORY_CARE_PROVIDER_SITE_OTHER): Payer: 59 | Admitting: *Deleted

## 2014-01-03 DIAGNOSIS — M722 Plantar fascial fibromatosis: Secondary | ICD-10-CM

## 2014-01-03 NOTE — Patient Instructions (Signed)

## 2014-01-03 NOTE — Progress Notes (Signed)
Dispensed patient's orthotics with oral and written instructions for wearing. Patient will follow up with Dr. Hyatt in 1 month for an orthotic check. 

## 2014-02-06 ENCOUNTER — Ambulatory Visit: Payer: 59 | Admitting: Podiatry

## 2014-02-13 NOTE — Progress Notes (Signed)
This encounter was created in error - please disregard.

## 2014-06-19 ENCOUNTER — Ambulatory Visit (INDEPENDENT_AMBULATORY_CARE_PROVIDER_SITE_OTHER): Payer: 59 | Admitting: Podiatry

## 2014-06-19 ENCOUNTER — Encounter: Payer: Self-pay | Admitting: Podiatry

## 2014-06-19 VITALS — BP 117/82 | HR 100 | Resp 12

## 2014-06-19 DIAGNOSIS — M722 Plantar fascial fibromatosis: Secondary | ICD-10-CM

## 2014-06-20 NOTE — Progress Notes (Signed)
She presents today states she still wearing her orthotics and that her foot started to hurt again. She states she continues to wear the shoes and regular basis.  Objective: Vital signs are stable she oriented x3. There is no erythema edema cellulitis drainage or odor. She has pain on palpation medial continued tubercle of the right foot. Consistent with plantar fasciitis.  Assessment: Plantar fasciitis right. Recurrent.  Plan: Reinjected right heel today plantar fascial strapping was applied discussed the possible need for surgery.

## 2014-12-11 ENCOUNTER — Other Ambulatory Visit (HOSPITAL_COMMUNITY): Payer: Self-pay | Admitting: Sports Medicine

## 2014-12-11 DIAGNOSIS — S92001A Unspecified fracture of right calcaneus, initial encounter for closed fracture: Secondary | ICD-10-CM

## 2014-12-15 ENCOUNTER — Encounter (HOSPITAL_COMMUNITY): Payer: Self-pay

## 2014-12-15 ENCOUNTER — Ambulatory Visit (HOSPITAL_COMMUNITY)
Admission: RE | Admit: 2014-12-15 | Discharge: 2014-12-15 | Disposition: A | Discharge status: Home / Self Care | Payer: 59 | Source: Ambulatory Visit | Attending: Sports Medicine | Admitting: Sports Medicine

## 2014-12-15 DIAGNOSIS — M25571 Pain in right ankle and joints of right foot: Secondary | ICD-10-CM | POA: Insufficient documentation

## 2014-12-15 DIAGNOSIS — W19XXXA Unspecified fall, initial encounter: Secondary | ICD-10-CM | POA: Diagnosis not present

## 2014-12-15 DIAGNOSIS — M7731 Calcaneal spur, right foot: Secondary | ICD-10-CM | POA: Diagnosis not present

## 2014-12-15 DIAGNOSIS — S92001A Unspecified fracture of right calcaneus, initial encounter for closed fracture: Secondary | ICD-10-CM

## 2015-10-21 DIAGNOSIS — M79641 Pain in right hand: Secondary | ICD-10-CM | POA: Diagnosis not present

## 2015-10-21 DIAGNOSIS — M0579 Rheumatoid arthritis with rheumatoid factor of multiple sites without organ or systems involvement: Secondary | ICD-10-CM | POA: Diagnosis not present

## 2015-10-21 DIAGNOSIS — M79642 Pain in left hand: Secondary | ICD-10-CM | POA: Diagnosis not present

## 2015-10-21 DIAGNOSIS — M79671 Pain in right foot: Secondary | ICD-10-CM | POA: Diagnosis not present

## 2015-10-21 DIAGNOSIS — M79672 Pain in left foot: Secondary | ICD-10-CM | POA: Diagnosis not present

## 2015-12-08 DIAGNOSIS — F909 Attention-deficit hyperactivity disorder, unspecified type: Secondary | ICD-10-CM | POA: Insufficient documentation

## 2015-12-08 DIAGNOSIS — G8921 Chronic pain due to trauma: Secondary | ICD-10-CM | POA: Insufficient documentation

## 2015-12-08 DIAGNOSIS — G43909 Migraine, unspecified, not intractable, without status migrainosus: Secondary | ICD-10-CM | POA: Insufficient documentation

## 2016-03-24 DIAGNOSIS — M0579 Rheumatoid arthritis with rheumatoid factor of multiple sites without organ or systems involvement: Secondary | ICD-10-CM | POA: Insufficient documentation

## 2016-04-06 IMAGING — CT CT ANKLE*R* W/O CM
4 of 6 series · 14 of 33 positions shown, 16 images · non-contrast
Comparison: None.

CLINICAL DATA: Fall. Heel pain. Calcaneal fracture. Initial
encounter.

EXAM:
CT OF THE RIGHT ANKLE WITHOUT CONTRAST
TECHNIQUE: Multidetector CT imaging of the right ankle was performed according
to the standard protocol. Multiplanar CT image reconstructions were
also generated.

[Series 7: coronal soft tissue · coronal · 0.29mm/px · 3 of 75 slices shown]
[im 15/75  bone]
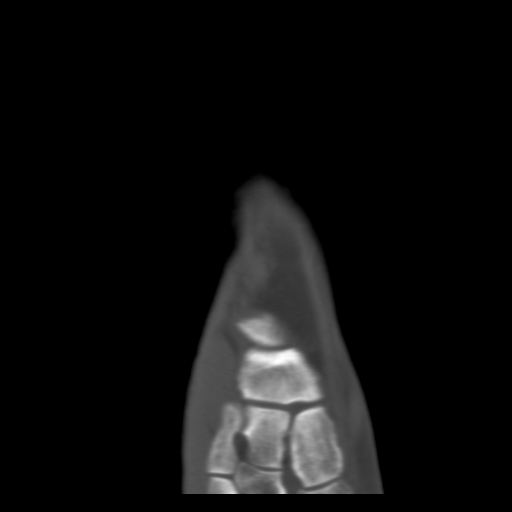
[im 30/75  bone]
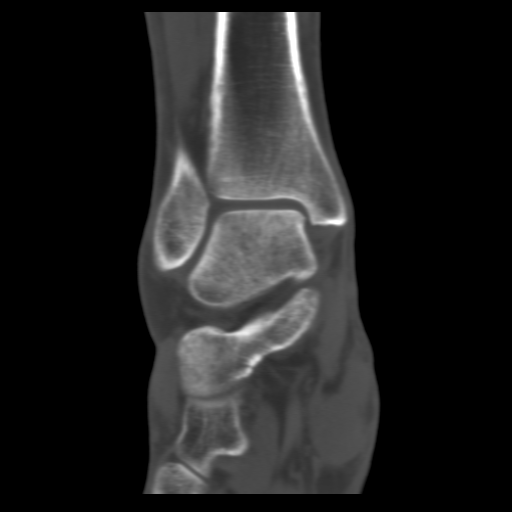
[im 45/75  bone]
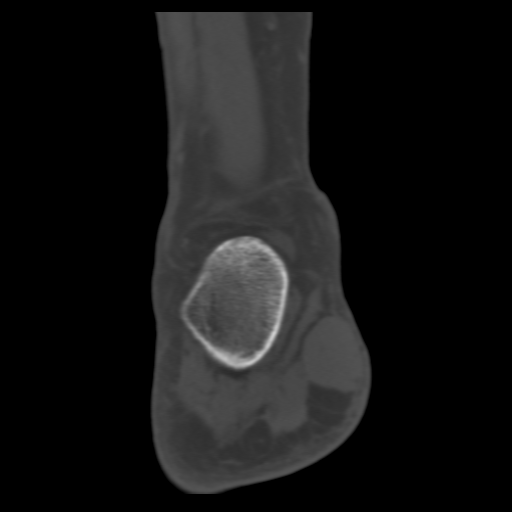

[Series 8: sagittal soft tissue · sagittal · 0.29mm/px · 5 of 41 slices shown, 6 images]
[im 14/41  bone]
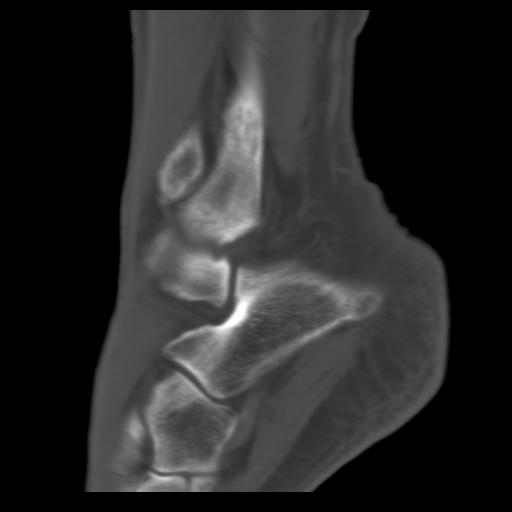
[im 17/41  bone]
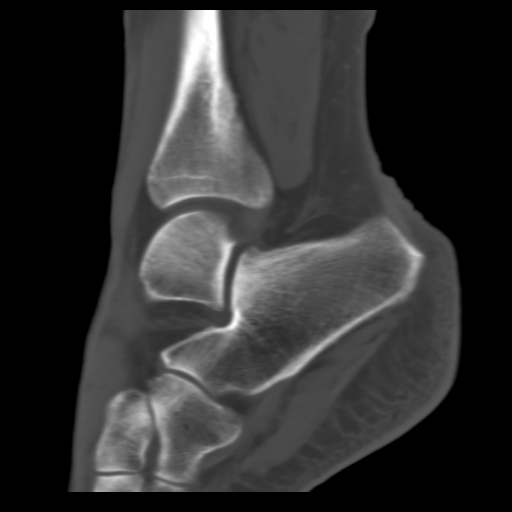
[im 21/41  soft-tissue]
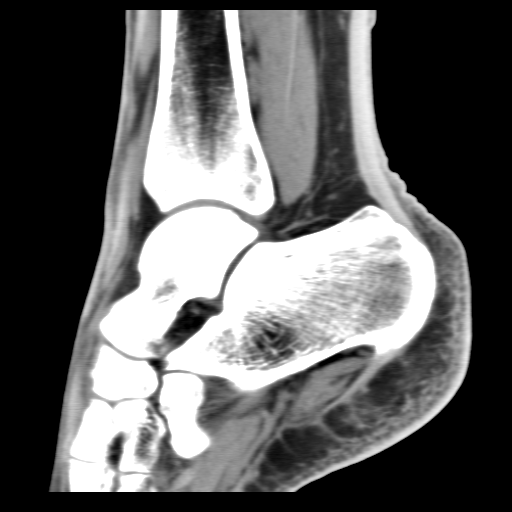
[im 21/41  bone]
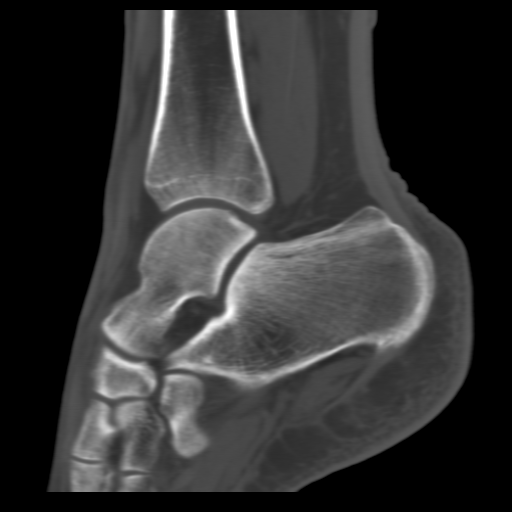
[im 24/41  bone]
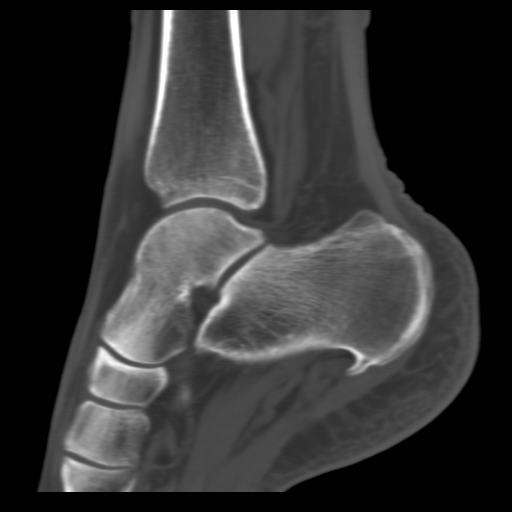
[im 27/41  bone]
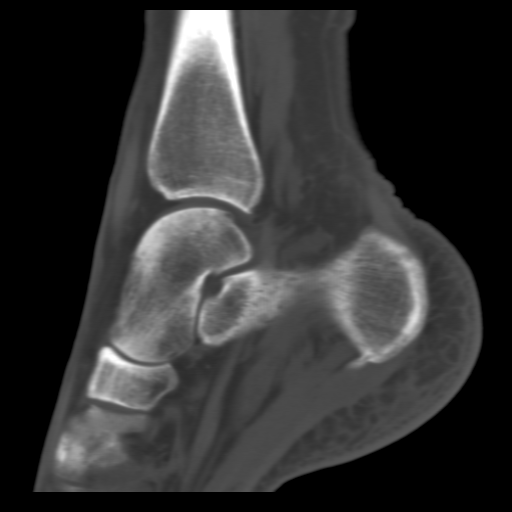

[Series 9: thin bone · axial · 0.37mm/px · z∈[-162,-92]mm · 3 of 141 slices shown, 4 images]
[im 36/141  soft-tissue]
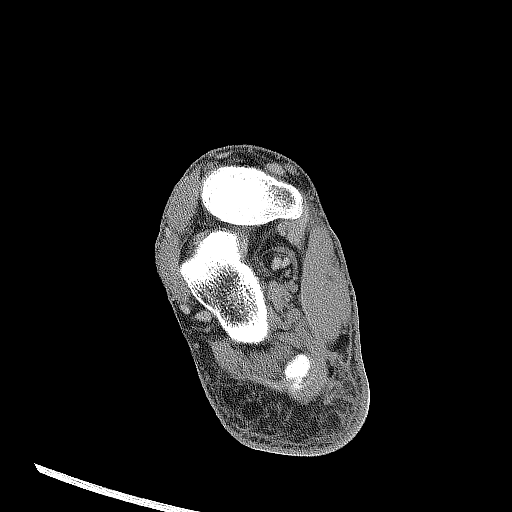
[im 36/141  bone]
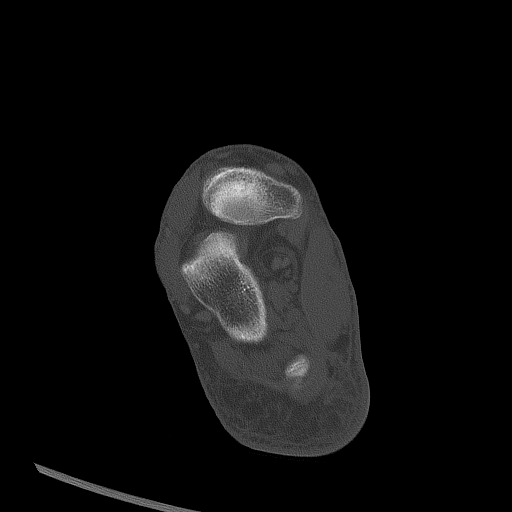
[im 71/141  bone]
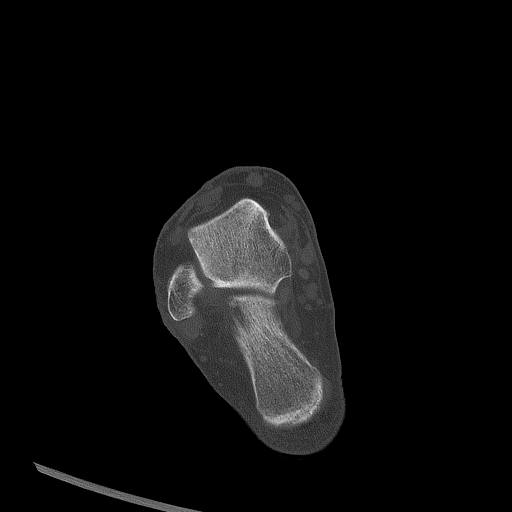
[im 106/141  bone]
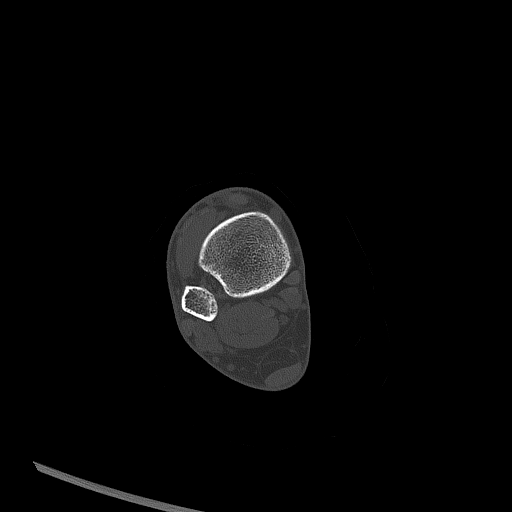

[Series 10: thin st · axial · 0.37mm/px · z∈[-162,-92]mm · 3 of 141 slices shown]
[im 36/141  bone]
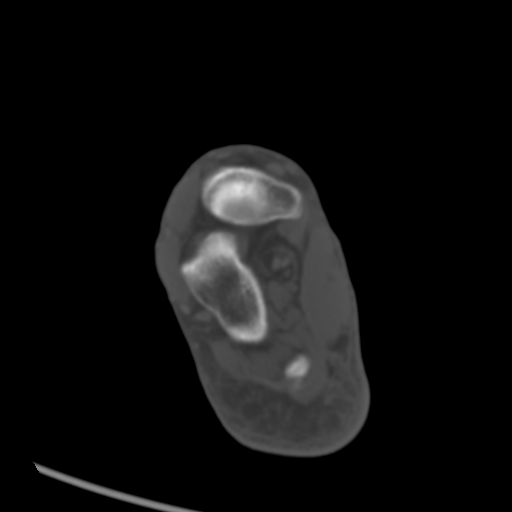
[im 71/141  bone]
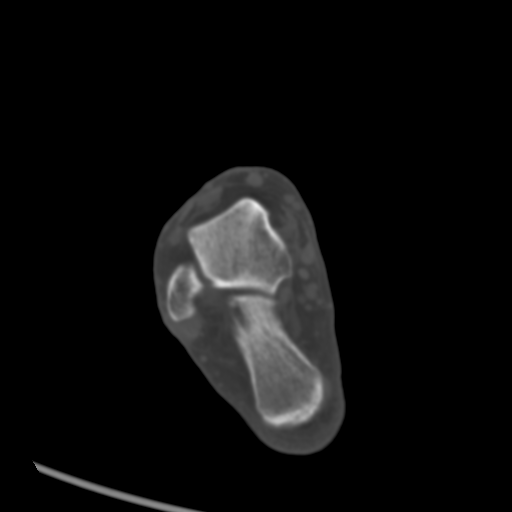
[im 106/141  bone]
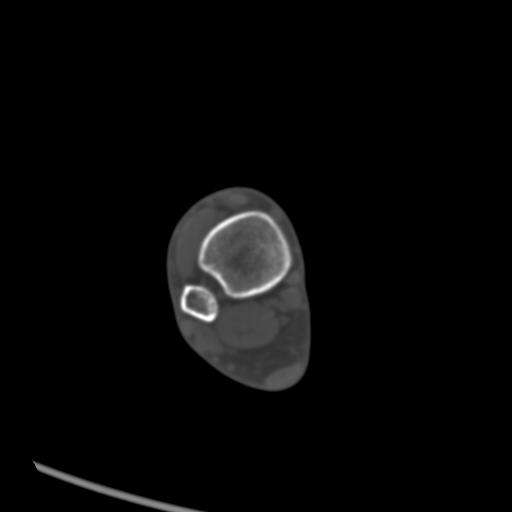

[14 of 33 positions shown; findings below may reference images not displayed]

FINDINGS: There is no calcaneus fracture. Plantar calcaneal spur is present.
The Achilles tendon appears within normal limits. No ankle effusion.
Talar dome intact. Talar head and neck appear within normal limits.
Navicular bone and visible midfoot bones appear normal. Subtalar
joints are within normal limits.

Grossly, posterior medial and posterior lateral tendons appear
normal. Anterior tendon group normal.
IMPRESSION: Negative for fracture, calcaneal or otherwise.  No traumatic injury.

## 2017-04-04 MED FILL — ENBREL SURCLICK/50MG/mL/PEN: ENBREL SURCLICK/50MG/mL/PEN | 28 days supply | Qty: 1 | Fill #1

## 2017-05-04 MED ORDER — ETANERCEPT 50 MG/ML (1 ML) SUBCUTANEOUS CARTRIDGE
1 refills | 0 days
Start: 2017-05-04 — End: 2018-05-04

## 2017-05-05 NOTE — Unmapped (Signed)
Per test claim for Enbrel at the [facility], patient needs Medication Assistance Program for High Copay.

## 2017-05-05 NOTE — Unmapped (Signed)
ENBREL MINI APPROVED FOR $100. Only the patient is allowed to sign up for the Enbrel Support Co-pay Assistance. Patient can call [phone], option 1, verify demographic information, and then receive co-pay assistance card if they qualify.    [facility]   Patient Onboarding/Medication Counseling    Ms.Abascal is a 46 y.o. female with ? who I am counseling today on initiation of therapy.    Medication: Enbrel mini    Verified patient's date of birth / HIPAA.      Education Provided: ??    Dose/Administration discussed: 1 cartridge in Enbrel mini device to inject 1 ml SubQ every 7 days. This medication should be taken  without regard to food.     Storage requirements: this medicine should be stored in the refrigerator.     Side effects discussed: na/ declined, has been on Enbrel Sureclick previously    Handling precautions reviewed:  Patient will dispose of needles in a sharps container or empty laundry detergent bottle.    Drug Interactions: other medications reviewed and up to date in Epic.  No drug interactions identified.    Comorbidities/Allergies: reviewed and up to date in Epic.    Verified therapy is appropriate and should continue      Delivery Information    Anticipated copay of $0 (with copay card) reviewed with patient. Verified delivery address in FSI and reviewed medication storage requirement.    Scheduled delivery date: Thurs, Aug 16    Explained that we ship using UPS and this shipment will not require a signature.      Explained the services we provide at [facility] and that each month we would call to set up refills.  Stressed importance of returning phone calls so that we could ensure they receive their medications in time each month.  Informed patient that we should be setting up refills 7-10 days prior to when they will run out of medication.  Informed patient that welcome packet will be sent.      Patient verbalized understanding of the above information as well as how to contact the pharmacy at [phone] option 4 with any questions/concerns.        Patient Specific Needs      ? Patient has no physical or cognitive barriers.    ? Patient prefers to have medications discussed with  Patient     ? Patient is able to read and understand education materials at a high school level or above.        Lanney Gins  [facility] Specialty Pharmacist

## 2017-05-10 MED FILL — ENBREL MINI (ML)/50MG/ML/SOCT: ENBREL MINI (ML)/50MG/ML/SOCT | 28 days supply | Qty: 4 | Fill #0

## 2017-06-15 NOTE — Unmapped (Signed)
[facility] Refill Coordination Note  Specialty Medication(s): ENBREL   Additional Medications shipped:     Angelynn L Treadway, DOB: 1971/08/10  Phone: [phone] (home) , Alternate phone contact: N/A  Phone or address changes today?: No  All above HIPAA information was verified with patient.  Shipping Address: [address]  [place] [address]   Insurance changes? No    Completed refill call assessment today to schedule patient's medication shipment from the [facility] [phone]).      Confirmed the medication and dosage are correct and have not changed: Yes, regimen is correct and unchanged.    Confirmed patient started or stopped the following medications in the past month:  No, there are no changes reported at this time.    Are you tolerating your medication?:  Felishia reports tolerating the medication.    ADHERENCE    (Below is required for Medicare Part B or Transplant patients only - per drug):   How many tablets were dispensed last month: 4 PENS  Patient currently has  remaining.    Did you miss any doses in the past 4 weeks? No missed doses reported.    FINANCIAL/SHIPPING    Delivery Scheduled: Yes, Expected medication delivery date: 06/20/17     Tela did not have any additional questions at this time.    Delivery address validated in FSI scheduling system: Yes, address listed in FSI is correct.    We will follow up with patient monthly for standard refill processing and delivery.      Thank you,  Mitzi Davenport   [facility] Specialty Technician

## 2017-06-19 MED FILL — ENBREL MINI (ML)/50MG/ML/SOCT: ENBREL MINI (ML)/50MG/ML/SOCT | 28 days supply | Qty: 4 | Fill #1

## 2017-07-11 MED ORDER — ETANERCEPT 50 MG/ML (1 ML) SUBCUTANEOUS CARTRIDGE
3 refills | 0 days
Start: 2017-07-11 — End: 2018-07-23

## 2017-07-11 NOTE — Unmapped (Signed)
[facility] Refill and Clinical Coordination Note  Medication(s): Enbrel    Lindsey Cook, DOB: 16-Dec-1970  Phone: [phone] (home) , Alternate phone contact: N/A  Shipping address: [address]  [place] [address]  Phone or address changes today?: No  All above HIPAA information verified.  Insurance changes? No    Completed refill and clinical call assessment today to schedule patient's medication shipment from the [facility] [phone]).      MEDICATION RECONCILIATION    Confirmed the medication and dosage are correct and have not changed: Yes, regimen is correct and unchanged.    Were there any changes to your medication(s) in the past month:  No, there are no changes reported at this time.    ADHERENCE    Is this medicine transplant or covered by Medicare Part B? No.        Did you miss any doses in the past 4 weeks? No missed doses reported.  Adherence counseling provided? Not needed     SIDE EFFECT MANAGEMENT    Are you tolerating your medication?:  Jeweldean reports tolerating the medication.  Side effect management discussed: None      Therapy is appropriate and should be continued.          FINANCIAL/SHIPPING    Delivery Scheduled: Yes, Expected medication delivery date: 07/14/17   Additional medications refilled: No additional medications/refills needed at this time.    Bobbye did not have any additional questions at this time.    Delivery address validated in FSI scheduling system: Yes, address listed above is correct.      We will follow up with patient monthly for standard refill processing and delivery.      Thank you,  Rollen Sox   [facility] Specialty Pharmacist

## 2017-07-18 MED FILL — ENBREL MINI (ML)/50MG/ML/SOCT: ENBREL MINI (ML)/50MG/ML/SOCT | 28 days supply | Qty: 4 | Fill #0

## 2017-08-10 MED FILL — ENBREL MINI (ML)/50MG/ML/SOCT: ENBREL MINI (ML)/50MG/ML/SOCT | 28 days supply | Qty: 4 | Fill #2

## 2017-10-04 MED FILL — ENBREL MINI (ML)/50MG/ML/SOCT: ENBREL MINI (ML)/50MG/ML/SOCT | 28 days supply | Qty: 4 | Fill #3

## 2017-10-04 NOTE — Unmapped (Signed)
[facility] Specialty Pharmacy Refill Coordination Note  Specialty Medication(s): Enbrel 50mg /ml  Additional Medications shipped: none    Lindsey Cook, DOB: 12/21/1970  Phone: [phone] (home) , Alternate phone contact: N/A  Phone or address changes today?: No  All above HIPAA information was verified with patient.  Shipping Address: [address]  [place] [address]   Insurance changes? No    Completed refill call assessment today to schedule patient's medication shipment from the [facility] [phone]).      Confirmed the medication and dosage are correct and have not changed: Yes, regimen is correct and unchanged.    Confirmed patient started or stopped the following medications in the past month:  No, there are no changes reported at this time.    Are you tolerating your medication?:  Lindsey Cook reports tolerating the medication.    ADHERENCE    Did you miss any doses in the past 4 weeks? Yes.  Lindsey Cook reports missing 14 days of medication therapy in the last 4 weeks.  Lindsey Cook reports diagnosed with pneumonia and was on antibiotics - MD stated to hold the med during that timeframe as the cause of their non-adherance.    FINANCIAL/SHIPPING    Delivery Scheduled: Yes, Expected medication delivery date: 10/05/17     Lindsey Cook did not have any additional questions at this time.    Delivery address validated in FSI scheduling system: Yes, address listed in FSI is correct.    We will follow up with patient monthly for standard refill processing and delivery.      Thank you,  Lindsey Cook   [facility] Specialty Pharmacist

## 2017-10-31 MED FILL — ENBREL SURCLICK/50MG/mL/PEN: ENBREL SURCLICK/50MG/mL/PEN | 28 days supply | Qty: 1 | Fill #0

## 2017-11-27 NOTE — Unmapped (Signed)
[facility] Specialty Pharmacy Refill Coordination Note  Specialty Medication(s): Enbrel 50mg /ml      Lindsey Cook, DOB: August 31, 1971  Phone: [phone] (home) , Alternate phone contact: N/A  Phone or address changes today?: No  All above HIPAA information was verified with patient.  Shipping Address: [address]  [place] [address]   Insurance changes? No    Completed refill call assessment today to schedule patient's medication shipment from the [facility] [phone]).      Confirmed the medication and dosage are correct and have not changed: Yes, regimen is correct and unchanged.    Confirmed patient started or stopped the following medications in the past month:  No, there are no changes reported at this time.    Are you tolerating your medication?:  Lindsey Cook reports tolerating the medication.    ADHERENCE  Did you miss any doses in the past 4 weeks? Yes.  Lindsey Cook reports missing 2 weeks days of medication therapy in the last 4 weeks.  Lindsey Cook reports sickness and antibiotic regimen as the cause of their non-adherance.    FINANCIAL/SHIPPING    Delivery Scheduled: Yes, Expected medication delivery date: 12/12/2017     The patient will receive an FSI print out for each medication shipped and additional FDA Medication Guides as required.  Patient education from [place] or Robet Leu may also be included in the shipment    Lindsey Cook did not have any additional questions at this time.    Delivery address validated in FSI scheduling system: Yes, address listed in FSI is correct.    We will follow up with patient monthly for standard refill processing and delivery.      Thank you,  Imanii Gosdin  Anders Grant   [facility] Specialty Pharmacist

## 2017-12-10 MED FILL — ENBREL SURCLICK/50MG/mL/PEN: ENBREL SURCLICK/50MG/mL/PEN | 28 days supply | Qty: 1 | Fill #1

## 2018-01-15 NOTE — Unmapped (Signed)
[facility] Specialty Pharmacy Refill and Clinical Coordination Note  Medication(s): Enbrel 50mg /ml    Lindsey Cook, DOB: 01-27-1971  Phone: [phone] (home) , Alternate phone contact: N/A  Shipping address: [address]  [place] [address]  Phone or address changes today?: No  All above HIPAA information verified.  Insurance changes? No    Completed refill and clinical call assessment today to schedule patient's medication shipment from the [facility] [phone]).      MEDICATION RECONCILIATION    Confirmed the medication and dosage are correct and have not changed: Yes, regimen is correct and unchanged.    Were there any changes to your medication(s) in the past month:  No, there are no changes reported at this time.    ADHERENCE    Is this medicine transplant or covered by Medicare Part B? No.    Did you miss any doses in the past 4 weeks? No missed doses reported.  Adherence counseling provided? Not needed     SIDE EFFECT MANAGEMENT    Are you tolerating your medication?:  [name] reports tolerating the medication.  Side effect management discussed: None      Therapy is appropriate and should be continued.    Evidence of clinical benefit: See Epic note from Patient sees outside provider, no access to clinical notes.  Patient feels well and wishes to continue medication.      FINANCIAL/SHIPPING    Delivery Scheduled: Yes, Expected medication delivery date: 01/17/2018   Additional medications refilled: No additional medications/refills needed at this time.    The patient will receive an FSI print out for each medication shipped and additional FDA Medication Guides as required.  Patient education from [place] or Robet Leu may also be included in the shipment.    [name] did not have any additional questions at this time.    Delivery address validated in FSI scheduling system: Yes, address listed above is correct.      We will follow up with patient monthly for standard refill processing and delivery.      Thank you,  Corynne Scibilia  Anders Grant   [facility] Specialty Pharmacist

## 2018-01-16 MED FILL — ENBREL SURCLICK/50MG/mL/PEN: ENBREL SURCLICK/50MG/mL/PEN | 28 days supply | Qty: 1 | Fill #2

## 2018-02-06 MED ORDER — HUMIRA PEN CITRATE FREE 40 MG/0.4 ML
PEN_INJECTOR | 2 refills | 0 days
Start: 2018-02-06 — End: 2018-06-27

## 2018-02-12 NOTE — Unmapped (Unsigned)
[facility] Specialty Medication Referral: PA APPROVED    Medication (Brand/Generic): HUMIRA    Initial FSI Test Claim completed with resulted information below:  No PA required  Patient ABLE to fill at [facility]  Insurance Company:  AHP  Anticipated Copay: $5  Is anticipated copay with a copay card or grant? YES    As Co-pay is under $100 defined limit, per policy there will be no further investigation of need for financial assistance at this time unless patient requests. This referral has been communicated to the provider and handed off to the [facility] team for further processing and filling of prescribed medication.   ______________________________________________________________________  Please utilize this referral for viewing purposes as it will serve as the central location for all relevant documentation and updates.

## 2018-02-20 MED ORDER — EMPTY CONTAINER
2 refills | 0 days
Start: 2018-02-20 — End: 2018-07-23

## 2018-02-20 NOTE — Unmapped (Signed)
[facility]   Patient Onboarding/Medication Counseling    Ms.Odonnel is a 47 y.o. female with RA who I am counseling today on initiation of therapy.    Medication: Humira 40mg /0.50ml    Verified patient's date of birth / HIPAA.      Education Provided: ??    Dose/Administration discussed: Inject contents of 1 syringe (40mg ) under the skin every 14 days. This medication should be taken  without regard to food.  Stressed the importance of taking medication as prescribed and to contact provider if that changes at any time.  Discussed missed dose instructions.    Storage requirements: this medicine should be stored in the refrigerator.     Side effects / precautions discussed: Discussed common side effects, including headache, signs of a common cold, belly pain, upset stomach, back pain, or irritation where the shot is given. If patient experiences signs of an allergic reactions (hives, itching, rash, trouble breathing/swallowing, swelling of mouth, face, lips tongue, or throat), signs of infection (fever, chills, very bad sore throat, ear/sinus pain, cough, sputum or change in color of sputum), sings of UTI (blood in urine, burning or pain when urinating, lower stomach pain, pelvic pain), signs of lupus (rash on cheeks or other body parts, sunburn easy, muscle or joint pain, chest pain or shortness of breath, swelling in the arms or legs), signs of high blood pressure (very bad headache or dizziness, passing out or change in eyesight), chest or pain or pressure, chest pain or pressure, night sweats, a big weight loss, fever, swollen gland, skin lump or growth, blood in the urine, pale sin, very bad irritation where the shot was given, signs of liver problems (dark urine, upset stomach or stomach pain, light-colored stools, throwing up)., they need to call the doctor.  Patient will receive a drug information handout with shipment.    Handling precautions / disposal reviewed:  Patient will dispose of needles in a sharps container or empty laundry detergent bottle.    Drug Interactions: other medications reviewed and up to date in Epic.  No drug interactions identified.    Comorbidities/Allergies: reviewed and up to date in Epic.    Verified therapy is appropriate and should continue      Delivery Information    Medication Assistance provided: Copay Assistance    Anticipated copay of $5 reviewed with patient. Verified delivery address in FSI and reviewed medication storage requirement.    Scheduled delivery date: 02/22/2018    Explained that we ship using UPS or courier and this shipment will not require a signature.      Explained the services we provide at [facility] and that each month we would call to set up refills.  Stressed importance of returning phone calls so that we could ensure they receive their medications in time each month.  Informed patient that we should be setting up refills 7-10 days prior to when they will run out of medication.  Informed patient that welcome packet will be sent.      Patient verbalized understanding of the above information as well as how to contact the pharmacy at [phone] option 4 with any questions/concerns.  The pharmacy is open Monday through Friday 8:30am-4:30pm.  A pharmacist is available 24/7 via pager to answer any clinical questions they may have.        Patient Specific Needs      ? Patient has no physical, cognitive, or cultural barriers.    ? Patient  prefers to have medications discussed with  Patient     ? Patient is able to read and understand education materials at a high school level or above.    ? Patient's primary language is  English           Saleema Weppler  Anders Grant  [facility] Specialty Pharmacist

## 2018-02-21 MED FILL — HUMIRA PEN *NO CITRATE*/40MG/0.4ML/PNKT: HUMIRA PEN *NO CITRATE*/40MG/0.4ML/PNKT | 28 days supply | Qty: 2 | Fill #0

## 2018-02-21 MED FILL — SHARPS KIT/NA/MISC: SHARPS KIT/NA/MISC | 120 days supply | Qty: 1 | Fill #0

## 2018-03-20 MED FILL — HUMIRA PEN *NO CITRATE*/40MG/0.4ML/PNKT: HUMIRA PEN *NO CITRATE*/40MG/0.4ML/PNKT | 28 days supply | Qty: 2 | Fill #1

## 2018-03-20 NOTE — Unmapped (Signed)
[facility] Specialty Pharmacy Refill and Clinical Coordination Note  Medication(s): Humira 40mg     Kaileia L Bily, DOB: 1971-06-13  Phone: [phone] (home) , Alternate phone contact: N/A  Shipping address: [address]  [place] [address]  Phone or address changes today?: No  All above HIPAA information verified.  Insurance changes? No    Completed refill and clinical call assessment today to schedule patient's medication shipment from the [facility] [phone]).      MEDICATION RECONCILIATION    Confirmed the medication and dosage are correct and have not changed: Yes, regimen is correct and unchanged.    Were there any changes to your medication(s) in the past month:  No, there are no changes reported at this time.    ADHERENCE    Is this medicine transplant or covered by Medicare Part B? No.      Did you miss any doses in the past 4 weeks? No missed doses reported.  Adherence counseling provided? Not needed     SIDE EFFECT MANAGEMENT    Are you tolerating your medication?:  Chantrice reports tolerating the medication.  Side effect management discussed: None      Therapy is appropriate and should be continued.    Evidence of clinical benefit: See Epic note from Patient requested refill. Uses outside provider.      FINANCIAL/SHIPPING    Delivery Scheduled: Yes, Expected medication delivery date: 03/21/2018   Additional medications refilled: No additional medications/refills needed at this time.    The patient will receive an FSI print out for each medication shipped and additional FDA Medication Guides as required.  Patient education from [place] or Robet Leu may also be included in the shipment.    Jahmya did not have any additional questions at this time.    Delivery address validated in FSI scheduling system: Yes, address listed above is correct.      We will follow up with patient monthly for standard refill processing and delivery.      Thank you,  Breck Coons [facility] Specialty Pharmacist

## 2018-03-28 ENCOUNTER — Encounter: Admit: 2018-03-28 | Discharge: 2018-03-29 | Payer: BLUE CROSS/BLUE SHIELD | Attending: Family | Primary: Family

## 2018-03-28 DIAGNOSIS — T671XXA Heat syncope, initial encounter: Principal | ICD-10-CM

## 2018-03-29 NOTE — Unmapped (Signed)
Urgent Care Department Provider Note        HPI   SUBJECTIVE:   Lindsey Cook is a 47 y.o. female who presents with complaints of syncope just a short time ago and was brought into the urgent care to be evaluated by her family.  Family member states that she was lying on the floor confused and vomiting.  Patient states that she feels dizzy and cannot really stand up and walk.  Patient was outside mowing her lawn and states that she felt bad and went inside and does not remember anything else and does not really remember being transported here to the urgent care.  Patient's family member states that she was confused.    History     No past medical history on file.    No past surgical history on file.    No family history on file.    Social History     Tobacco Use   ??? Smoking status: Not on file   Substance Use Topics   ??? Alcohol use: Not on file   ??? Drug use: Not on file         Current Outpatient Medications:   ???  etanercept (ENBREL) injection 50 mg/mL PEN, Inject 50 mg under the skin every seven (7) days., Disp: , Rfl:     ROS     As noted in HPI. All other ROS negative.       Physical Exam     BP 155/80  - Pulse 106  - Temp 36.8 ??C (98.3 ??F) (Oral)  - Resp 20  - Wt 96.3 kg (212 lb 3.2 oz)     Constitutional:  Well developed, well nourished, no acute distress  Eyes:   EOMI, conjunctiva normal bilaterally  HENT:  Normocephalic, atraumatic,   Respiratory:  Normal inspiratory effort lungs CTA bilateral  Cardiovascular:  Normal rate S1-S2 without murmur  GI:  nondistended soft nontender bowel sounds active x4  Integument: No rash, skin intact  Musculoskeletal:  No edema, no deformities  Neurologic:  Alert & oriented x 3, no focal neuro deficits  Psychiatric:  Speech and behavior appropriate       Urgent Care Course     @EDMEDS @  No orders of the defined types were placed in this encounter.      No results found for this or any previous visit.    Patient was never admitted.  Urgent Care Clinical Impression     Final diagnoses:   None       Urgent Care Assessment/Plan   Advised patient that we need to transport her to the emergency room and 911 is called EMS response patient is taken via stretcher and ambulance to the emergency department.    Discussed imaging, labs, medical decision-making, plan for follow-up with patient. Discussed signs and symptoms that should prompt return to the emergency department. Patient agrees with plan.    New Prescriptions    No medications on file       *This clinic note was created using Dragon dictation software.  Therefore, there may be occasional mistakes despite careful proofreading.

## 2018-04-10 NOTE — Unmapped (Signed)
[facility] Specialty Pharmacy Refill Coordination Note  Specialty Medication(s): Humira 40mg /0.35ml      Lindsey Cook, DOB: 1971-09-14  Phone: [phone] (home) , Alternate phone contact: N/A  Phone or address changes today?: No  All above HIPAA information was verified with patient.  Shipping Address: [address]  [place] [address]   Insurance changes? No    Completed refill call assessment today to schedule patient's medication shipment from the [facility] [phone]).      Confirmed the medication and dosage are correct and have not changed: Yes, regimen is correct and unchanged.    Confirmed patient started or stopped the following medications in the past month:  No, there are no changes reported at this time.    Are you tolerating your medication?:  Lindsey Cook reports tolerating the medication.    ADHERENCE  Did you miss any doses in the past 4 weeks? No missed doses reported.    FINANCIAL/SHIPPING    Delivery Scheduled: Yes, Expected medication delivery date: 04/12/2018     The patient will receive an FSI print out for each medication shipped and additional FDA Medication Guides as required.  Patient education from [place] or Robet Leu may also be included in the shipment    Lindsey Cook did not have any additional questions at this time.    Delivery address validated in FSI scheduling system: Yes, address listed in FSI is correct.    We will follow up with patient monthly for standard refill processing and delivery.      Thank you,  Marlon Vonruden  Anders Grant   [facility] Specialty Pharmacist

## 2018-04-11 MED FILL — HUMIRA PEN *NO CITRATE*/40MG/0.4ML/PNKT: HUMIRA PEN *NO CITRATE*/40MG/0.4ML/PNKT | 28 days supply | Qty: 2 | Fill #2

## 2018-05-02 NOTE — Unmapped (Signed)
[facility] Specialty Pharmacy Refill Coordination Note    Specialty Medication(s) to be Shipped:   HUMIRA  Other medication(s) to be shipped:       Lindsey Cook, DOB: 1970-11-30  Phone: [phone] (home)   Shipping Address: [address]  [place] [address]    All above HIPAA information was verified with patient.     Completed refill call assessment today to schedule patient's medication shipment from the [facility] [phone]).       Specialty medication(s) and dose(s) confirmed: Regimen is correct and unchanged.   Changes to medications: Phoua reports no changes reported at this time.  Changes to insurance: No  Questions for the pharmacist: No    The patient will receive an FSI print out for each medication shipped and additional FDA Medication Guides as required.  Patient education from [place] or Robet Leu may also be included in the shipment.    DISEASE/MEDICATION-SPECIFIC INFORMATION        N/A    ADHERENCE              MEDICARE PART B DOCUMENTATION         SHIPPING     Shipping address confirmed in FSI.     Delivery Scheduled: Yes, Expected medication delivery date: [phone]  via UPS or courier.     Antonietta Barcelona   [facility] Specialty Technician

## 2018-05-09 MED ORDER — HUMIRA PEN CITRATE FREE 40 MG/0.4 ML
SUBCUTANEOUS | 2 refills | 0 days
Start: 2018-05-09 — End: 2018-06-27

## 2018-05-09 MED FILL — HUMIRA PEN CITRATE FREE 40 MG/0.4 ML: 28 days supply | Qty: 2 | Fill #0 | Status: AC

## 2018-05-09 MED FILL — HUMIRA PEN CITRATE FREE 40 MG/0.4 ML: SUBCUTANEOUS | 28 days supply | Qty: 2 | Fill #0

## 2018-05-31 NOTE — Unmapped (Signed)
pt states her RA dr took her off Humira for now,she started on a tablet,not filled her ,she requested [place] to call back in 1 month to see if she will restart Humira

## 2018-06-27 NOTE — Unmapped (Signed)
Patient reported that MD discontinued the USE of Humira, un-enrolling.

## 2018-07-23 ENCOUNTER — Encounter: Admit: 2018-07-23 | Discharge: 2018-07-24 | Payer: BLUE CROSS/BLUE SHIELD

## 2018-07-23 DIAGNOSIS — Z01419 Encounter for gynecological examination (general) (routine) without abnormal findings: Principal | ICD-10-CM

## 2018-07-23 DIAGNOSIS — Z01411 Encounter for gynecological examination (general) (routine) with abnormal findings: Secondary | ICD-10-CM

## 2018-07-23 DIAGNOSIS — N6320 Unspecified lump in the left breast, unspecified quadrant: Secondary | ICD-10-CM

## 2018-07-23 DIAGNOSIS — N6452 Nipple discharge: Secondary | ICD-10-CM

## 2018-07-23 DIAGNOSIS — Z975 Presence of (intrauterine) contraceptive device: Secondary | ICD-10-CM

## 2018-07-23 DIAGNOSIS — Z113 Encounter for screening for infections with a predominantly sexual mode of transmission: Secondary | ICD-10-CM

## 2019-03-27 DIAGNOSIS — Z8619 Personal history of other infectious and parasitic diseases: Secondary | ICD-10-CM

## 2019-03-27 HISTORY — DX: Personal history of other infectious and parasitic diseases: Z86.19

## 2019-04-05 ENCOUNTER — Encounter: Admit: 2019-04-05 | Discharge: 2019-04-06 | Payer: BLUE CROSS/BLUE SHIELD | Attending: Family | Primary: Family

## 2019-04-05 ENCOUNTER — Encounter: Admit: 2019-04-05 | Discharge: 2019-04-05 | Payer: BLUE CROSS/BLUE SHIELD | Attending: Family | Primary: Family

## 2019-04-05 DIAGNOSIS — K829 Disease of gallbladder, unspecified: Principal | ICD-10-CM

## 2019-04-05 DIAGNOSIS — Z20828 Contact with and (suspected) exposure to other viral communicable diseases: Secondary | ICD-10-CM

## 2019-04-05 DIAGNOSIS — R109 Unspecified abdominal pain: Principal | ICD-10-CM

## 2019-04-05 DIAGNOSIS — Z20822 Contact with and (suspected) exposure to covid-19: Secondary | ICD-10-CM | POA: Insufficient documentation

## 2019-06-18 ENCOUNTER — Encounter
Admit: 2019-06-18 | Discharge: 2019-06-19 | Payer: BLUE CROSS/BLUE SHIELD | Attending: Nurse Practitioner | Primary: Nurse Practitioner

## 2019-06-18 DIAGNOSIS — M25531 Pain in right wrist: Secondary | ICD-10-CM

## 2019-06-18 DIAGNOSIS — S63501A Unspecified sprain of right wrist, initial encounter: Secondary | ICD-10-CM

## 2019-06-18 MED ORDER — IBUPROFEN 800 MG TABLET
ORAL_TABLET | Freq: Three times a day (TID) | ORAL | 0 refills | 10.00000 days | Status: CP
Start: 2019-06-18 — End: 2019-06-28

## 2019-07-03 ENCOUNTER — Ambulatory Visit: Admit: 2019-07-03 | Discharge: 2019-07-04 | Payer: BLUE CROSS/BLUE SHIELD

## 2019-07-03 ENCOUNTER — Encounter: Admit: 2019-07-03 | Discharge: 2019-07-03 | Payer: BLUE CROSS/BLUE SHIELD

## 2019-07-03 DIAGNOSIS — J029 Acute pharyngitis, unspecified: Secondary | ICD-10-CM

## 2019-07-24 ENCOUNTER — Encounter: Admit: 2019-07-24 | Discharge: 2019-07-25 | Payer: BLUE CROSS/BLUE SHIELD

## 2019-07-24 DIAGNOSIS — Z23 Encounter for immunization: Principal | ICD-10-CM

## 2019-09-24 ENCOUNTER — Encounter: Admit: 2019-09-24 | Discharge: 2019-09-25 | Payer: BLUE CROSS/BLUE SHIELD

## 2019-09-24 ENCOUNTER — Encounter: Admit: 2019-09-24 | Discharge: 2019-09-24 | Payer: BLUE CROSS/BLUE SHIELD

## 2019-09-24 DIAGNOSIS — R509 Fever, unspecified: Principal | ICD-10-CM

## 2019-09-24 DIAGNOSIS — R5383 Other fatigue: Principal | ICD-10-CM

## 2019-09-24 DIAGNOSIS — R197 Diarrhea, unspecified: Principal | ICD-10-CM

## 2019-11-04 ENCOUNTER — Encounter: Admit: 2019-11-04 | Discharge: 2019-11-05 | Payer: BLUE CROSS/BLUE SHIELD

## 2019-12-02 ENCOUNTER — Encounter: Admit: 2019-12-02 | Discharge: 2019-12-03 | Payer: BLUE CROSS/BLUE SHIELD

## 2020-04-21 ENCOUNTER — Encounter: Admit: 2020-04-21 | Payer: BLUE CROSS/BLUE SHIELD

## 2020-05-21 MED ORDER — CYCLOBENZAPRINE 10 MG TABLET
0 days
Start: 2020-05-21 — End: ?

## 2020-05-25 ENCOUNTER — Encounter: Admit: 2020-05-25 | Discharge: 2020-05-26 | Payer: PRIVATE HEALTH INSURANCE

## 2020-05-25 DIAGNOSIS — Z975 Presence of (intrauterine) contraceptive device: Principal | ICD-10-CM

## 2020-05-25 DIAGNOSIS — A630 Anogenital (venereal) warts: Principal | ICD-10-CM

## 2020-05-25 DIAGNOSIS — R300 Dysuria: Principal | ICD-10-CM

## 2020-05-25 DIAGNOSIS — Z01419 Encounter for gynecological examination (general) (routine) without abnormal findings: Principal | ICD-10-CM

## 2020-05-25 DIAGNOSIS — M47816 Spondylosis without myelopathy or radiculopathy, lumbar region: Secondary | ICD-10-CM | POA: Insufficient documentation

## 2020-05-25 DIAGNOSIS — M5416 Radiculopathy, lumbar region: Secondary | ICD-10-CM | POA: Insufficient documentation

## 2020-05-25 DIAGNOSIS — M7710 Lateral epicondylitis, unspecified elbow: Secondary | ICD-10-CM | POA: Insufficient documentation

## 2020-05-25 DIAGNOSIS — M47817 Spondylosis without myelopathy or radiculopathy, lumbosacral region: Secondary | ICD-10-CM | POA: Insufficient documentation

## 2020-05-25 DIAGNOSIS — M51369 Other intervertebral disc degeneration, lumbar region without mention of lumbar back pain or lower extremity pain: Secondary | ICD-10-CM | POA: Insufficient documentation

## 2020-05-25 DIAGNOSIS — M461 Sacroiliitis, not elsewhere classified: Secondary | ICD-10-CM | POA: Insufficient documentation

## 2020-05-25 DIAGNOSIS — M5136 Other intervertebral disc degeneration, lumbar region: Secondary | ICD-10-CM | POA: Insufficient documentation

## 2020-05-27 ENCOUNTER — Ambulatory Visit: Admit: 2020-05-27 | Discharge: 2020-05-28 | Payer: PRIVATE HEALTH INSURANCE

## 2020-06-25 DIAGNOSIS — Z1231 Encounter for screening mammogram for malignant neoplasm of breast: Principal | ICD-10-CM

## 2020-06-29 ENCOUNTER — Encounter (INDEPENDENT_AMBULATORY_CARE_PROVIDER_SITE_OTHER): Payer: Self-pay

## 2020-10-27 DIAGNOSIS — I701 Atherosclerosis of renal artery: Secondary | ICD-10-CM

## 2020-10-27 HISTORY — DX: Atherosclerosis of renal artery: I70.1

## 2021-01-13 DIAGNOSIS — G5700 Lesion of sciatic nerve, unspecified lower limb: Secondary | ICD-10-CM | POA: Insufficient documentation

## 2021-11-05 MED ORDER — DEXTROAMPHETAMINE-AMPHETAMINE 10 MG TABLET
ORAL_TABLET | Freq: Two times a day (BID) | ORAL | 0 refills | 15 days
Start: 2021-11-05 — End: ?

## 2021-11-29 ENCOUNTER — Ambulatory Visit: Admit: 2021-11-29 | Discharge: 2021-11-30 | Payer: PRIVATE HEALTH INSURANCE

## 2021-11-29 DIAGNOSIS — Z01419 Encounter for gynecological examination (general) (routine) without abnormal findings: Principal | ICD-10-CM

## 2021-11-29 DIAGNOSIS — Z1509 Genetic susceptibility to other malignant neoplasm: Principal | ICD-10-CM

## 2021-11-29 DIAGNOSIS — Z975 Presence of (intrauterine) contraceptive device: Principal | ICD-10-CM

## 2021-11-29 DIAGNOSIS — Z9851 Tubal ligation status: Principal | ICD-10-CM

## 2021-11-29 DIAGNOSIS — Z803 Family history of malignant neoplasm of breast: Principal | ICD-10-CM

## 2021-11-29 DIAGNOSIS — A630 Anogenital (venereal) warts: Principal | ICD-10-CM

## 2021-11-29 DIAGNOSIS — Z9189 Other specified personal risk factors, not elsewhere classified: Principal | ICD-10-CM

## 2021-11-29 MED ORDER — NYSTATIN 100,000 UNIT/GRAM TOPICAL POWDER
1 refills | 0 days | Status: CP
Start: 2021-11-29 — End: ?

## 2021-12-07 MED ORDER — IBUPROFEN 800 MG TABLET
ORAL_TABLET | Freq: Four times a day (QID) | ORAL | 0 refills | 30 days | PRN
Start: 2021-12-07 — End: ?

## 2021-12-14 ENCOUNTER — Ambulatory Visit: Admit: 2021-12-14 | Discharge: 2021-12-15 | Payer: PRIVATE HEALTH INSURANCE

## 2021-12-14 DIAGNOSIS — Z9889 Other specified postprocedural states: Principal | ICD-10-CM

## 2021-12-14 DIAGNOSIS — R87612 Low grade squamous intraepithelial lesion on cytologic smear of cervix (LGSIL): Principal | ICD-10-CM

## 2021-12-14 DIAGNOSIS — A630 Anogenital (venereal) warts: Principal | ICD-10-CM

## 2022-01-07 ENCOUNTER — Ambulatory Visit
Admit: 2022-01-07 | Discharge: 2022-01-07 | Payer: PRIVATE HEALTH INSURANCE | Attending: Obstetrics & Gynecology | Primary: Obstetrics & Gynecology

## 2022-01-07 ENCOUNTER — Ambulatory Visit: Admit: 2022-01-07 | Discharge: 2022-01-07 | Payer: PRIVATE HEALTH INSURANCE

## 2022-01-07 DIAGNOSIS — R87612 Low grade squamous intraepithelial lesion on cytologic smear of cervix (LGSIL): Principal | ICD-10-CM

## 2022-01-10 ENCOUNTER — Ambulatory Visit: Admit: 2022-01-10 | Discharge: 2022-01-10 | Payer: PRIVATE HEALTH INSURANCE

## 2022-01-10 ENCOUNTER — Encounter
Admit: 2022-01-10 | Discharge: 2022-01-10 | Payer: PRIVATE HEALTH INSURANCE | Attending: Anesthesiology | Primary: Anesthesiology

## 2022-01-10 HISTORY — PX: CERVICAL CONIZATION W/BX: SHX1330

## 2022-01-10 MED ORDER — IBUPROFEN 800 MG TABLET
ORAL_TABLET | Freq: Four times a day (QID) | ORAL | 0 refills | 8 days | Status: CP | PRN
Start: 2022-01-10 — End: ?

## 2022-01-17 ENCOUNTER — Ambulatory Visit
Admit: 2022-01-17 | Discharge: 2022-01-18 | Payer: PRIVATE HEALTH INSURANCE | Attending: Obstetrics & Gynecology | Primary: Obstetrics & Gynecology

## 2022-01-17 DIAGNOSIS — Z09 Encounter for follow-up examination after completed treatment for conditions other than malignant neoplasm: Principal | ICD-10-CM

## 2022-01-17 MED ORDER — OXYCODONE-ACETAMINOPHEN 5 MG-325 MG TABLET
ORAL_TABLET | ORAL | 0 refills | 2 days | Status: CP | PRN
Start: 2022-01-17 — End: ?

## 2022-01-17 MED ORDER — LIDOCAINE HCL 2 % MUCOSAL JELLY
Freq: Two times a day (BID) | TOPICAL | 0 refills | 0 days | Status: CP
Start: 2022-01-17 — End: 2023-01-17

## 2022-01-20 ENCOUNTER — Ambulatory Visit: Admit: 2022-01-20 | Payer: PRIVATE HEALTH INSURANCE

## 2022-01-24 ENCOUNTER — Ambulatory Visit
Admit: 2022-01-24 | Discharge: 2022-01-25 | Payer: PRIVATE HEALTH INSURANCE | Attending: Obstetrics & Gynecology | Primary: Obstetrics & Gynecology

## 2022-01-24 DIAGNOSIS — Z09 Encounter for follow-up examination after completed treatment for conditions other than malignant neoplasm: Principal | ICD-10-CM

## 2022-01-24 MED ORDER — LIDOCAINE 5 % TOPICAL OINTMENT
Freq: Three times a day (TID) | TOPICAL | 1 refills | 0 days | Status: CP
Start: 2022-01-24 — End: 2023-01-24

## 2022-01-31 ENCOUNTER — Ambulatory Visit
Admit: 2022-01-31 | Discharge: 2022-02-01 | Payer: PRIVATE HEALTH INSURANCE | Attending: Obstetrics & Gynecology | Primary: Obstetrics & Gynecology

## 2022-02-14 MED ORDER — ACETAMINOPHEN 300 MG-CODEINE 30 MG TABLET
ORAL_TABLET | Freq: Four times a day (QID) | ORAL | 0 refills | 5.00000 days | Status: CP
Start: 2022-02-14 — End: 2022-02-14

## 2022-02-17 ENCOUNTER — Ambulatory Visit
Admit: 2022-02-17 | Discharge: 2022-02-18 | Payer: PRIVATE HEALTH INSURANCE | Attending: Obstetrics & Gynecology | Primary: Obstetrics & Gynecology

## 2022-02-17 DIAGNOSIS — Z09 Encounter for follow-up examination after completed treatment for conditions other than malignant neoplasm: Principal | ICD-10-CM

## 2022-02-17 MED ORDER — ACETAMINOPHEN 300 MG-CODEINE 30 MG TABLET
ORAL_TABLET | Freq: Four times a day (QID) | ORAL | 0 refills | 5 days | Status: CP
Start: 2022-02-17 — End: ?

## 2022-03-07 ENCOUNTER — Ambulatory Visit
Admit: 2022-03-07 | Discharge: 2022-03-08 | Payer: PRIVATE HEALTH INSURANCE | Attending: Obstetrics & Gynecology | Primary: Obstetrics & Gynecology

## 2022-03-07 DIAGNOSIS — N891 Moderate vaginal dysplasia: Principal | ICD-10-CM

## 2022-03-11 DIAGNOSIS — N891 Moderate vaginal dysplasia: Principal | ICD-10-CM

## 2022-03-22 ENCOUNTER — Telehealth: Payer: Self-pay | Admitting: *Deleted

## 2022-04-11 DIAGNOSIS — I10 Essential (primary) hypertension: Secondary | ICD-10-CM | POA: Insufficient documentation

## 2022-04-15 ENCOUNTER — Encounter: Payer: Self-pay | Admitting: Gynecologic Oncology

## 2022-04-18 ENCOUNTER — Inpatient Hospital Stay (HOSPITAL_BASED_OUTPATIENT_CLINIC_OR_DEPARTMENT_OTHER): Payer: BC Managed Care – PPO | Admitting: Gynecologic Oncology

## 2022-04-18 ENCOUNTER — Other Ambulatory Visit: Payer: Self-pay

## 2022-04-18 ENCOUNTER — Inpatient Hospital Stay: Payer: BC Managed Care – PPO | Attending: Gynecologic Oncology | Admitting: Gynecologic Oncology

## 2022-04-18 ENCOUNTER — Encounter: Payer: Self-pay | Admitting: Gynecologic Oncology

## 2022-04-18 VITALS — HR 98 | Temp 98.7°F | Resp 16 | Ht 66.0 in | Wt 203.4 lb

## 2022-04-18 DIAGNOSIS — Z6832 Body mass index (BMI) 32.0-32.9, adult: Secondary | ICD-10-CM | POA: Insufficient documentation

## 2022-04-18 DIAGNOSIS — N870 Mild cervical dysplasia: Secondary | ICD-10-CM | POA: Diagnosis not present

## 2022-04-18 DIAGNOSIS — M069 Rheumatoid arthritis, unspecified: Secondary | ICD-10-CM | POA: Diagnosis not present

## 2022-04-18 DIAGNOSIS — Z975 Presence of (intrauterine) contraceptive device: Secondary | ICD-10-CM | POA: Insufficient documentation

## 2022-04-18 DIAGNOSIS — I10 Essential (primary) hypertension: Secondary | ICD-10-CM | POA: Diagnosis not present

## 2022-04-18 DIAGNOSIS — N891 Moderate vaginal dysplasia: Secondary | ICD-10-CM | POA: Insufficient documentation

## 2022-04-18 DIAGNOSIS — N903 Dysplasia of vulva, unspecified: Secondary | ICD-10-CM

## 2022-04-18 DIAGNOSIS — Z7969 Long term (current) use of other immunomodulators and immunosuppressants: Secondary | ICD-10-CM | POA: Insufficient documentation

## 2022-04-18 DIAGNOSIS — F17210 Nicotine dependence, cigarettes, uncomplicated: Secondary | ICD-10-CM | POA: Diagnosis not present

## 2022-04-18 DIAGNOSIS — Z79899 Other long term (current) drug therapy: Secondary | ICD-10-CM | POA: Insufficient documentation

## 2022-04-18 DIAGNOSIS — Z87440 Personal history of urinary (tract) infections: Secondary | ICD-10-CM | POA: Diagnosis not present

## 2022-04-18 DIAGNOSIS — I499 Cardiac arrhythmia, unspecified: Secondary | ICD-10-CM | POA: Insufficient documentation

## 2022-04-18 DIAGNOSIS — B977 Papillomavirus as the cause of diseases classified elsewhere: Secondary | ICD-10-CM

## 2022-04-18 DIAGNOSIS — Z791 Long term (current) use of non-steroidal anti-inflammatories (NSAID): Secondary | ICD-10-CM | POA: Insufficient documentation

## 2022-04-18 DIAGNOSIS — Z9889 Other specified postprocedural states: Secondary | ICD-10-CM | POA: Diagnosis not present

## 2022-04-18 DIAGNOSIS — A630 Anogenital (venereal) warts: Secondary | ICD-10-CM | POA: Insufficient documentation

## 2022-04-18 DIAGNOSIS — D849 Immunodeficiency, unspecified: Secondary | ICD-10-CM

## 2022-04-18 MED ORDER — OXYCODONE HCL 5 MG PO TABS: 5.0000 mg | ORAL_TABLET | ORAL | 0 refills | Status: DC | PRN

## 2022-04-18 MED ORDER — SENNOSIDES-DOCUSATE SODIUM 8.6-50 MG PO TABS: 2.0000 | ORAL_TABLET | Freq: Every day | ORAL | 0 refills | Status: DC

## 2022-04-18 NOTE — H&P (View-Only) (Signed)
GYNECOLOGIC ONCOLOGY NEW PATIENT CONSULTATION   Patient Name: Briana Jackson  Patient Age: 51 y.o. 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  We discussed her history of cervical and now vulvar dysplasia.  We discussed the role of HPV in the pathogenesis of the majority of cervical, vulvar and vaginal dysplasia.   With regard to her low grade cervical dysplasia, given the low risk of progression to cancer, this will be followed with close surveillance (cotesting in one year). With regard to her high-grade vulvar dysplasia, given increased risk of progression to cancer, I recommend proceeding with treatment. We discussed options for surgical treatment (excision and laser ablation) as well as topical therapy (eg 5FU).  Given multifocal disease, I recommended combination of excision and laser ablation.  I think the right medial labial minora conglomeration of condylomatous lesions is amenable for resection.  The areas along the perineum and posterior fourchette are better served by laser ablation.   We also discussed the risk of recurrence after treatment and the need for long-term surveillance with her OBGYN.    The patient has rheumatoid arthritis and is on medications for this that can cause immunosuppression.  Additionally, she has a long history and current history of tobacco use.  We reviewed that cigarette smoking is a cofactor in the development of vulvar cancer.  I encouraged consideration of decreasing her tobacco use or quitting.   Plan will be for EUA, possible vulvar biopsies, vulvar laser ablation, partial simple vulvectomy, and any other indicated  procedures. We discussed risks of surgery which included but are not limited to bleeding, need for blood transfusion, infection, damage to surrounding structure, anesthesia risk thromboembolic events, unforeseen complications, and medical complications. The patient will receive DVT and antibiotic prophylaxis as indicated. She voiced a clear understanding. She had the opportunity to ask questions. Perioperative instructions were reviewed with her. Prescriptions for post-op medications were sent to her pharmacy of choice.  A copy of this note was sent to the patient's referring provider.   65 minutes of total time was spent for this patient encounter, including preparation, face-to-face counseling with the patient and coordination of care, and documentation of the encounter.   Eugene Garnet, MD  Division of Gynecologic Oncology  Department of Obstetrics and Gynecology  [facility]  ___________________________________________  Chief Complaint: Chief Complaint  Patient presents with   VAIN II (vaginal intraepithelial neoplasia grade II)    History of Present Illness:  Briana Jackson is a 51 y.o. y.o. female who is seen in consultation at the request of Ernestina Penna, MD for an evaluation of high-grade vulvar dysplasia.  Patient reports a history of abnormal Pap smears.  She underwent a LEEP procedure approximately 20 years ago at [facility] for cervical dysplasia.  She reports normal Pap smears until this year.  Pap test was performed 12/02/2021 revealing L SIL.  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  Cervical biopsy with fragments of benign squamous mucosa.  Pathology revealed low-grade squamous intraepithelial lesion of the cervix, endocervical margin focally involved, ectocervical margin not involved.  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   Since surgery, the patient  notes having  prolonged healing.  She had some issues where the condyloma were removed along the perineum.  This feels like it has now healed although she describes having some scar tissue in the area.  Patient denies any vaginal bleeding or discharge.  She has had a Mirena IUD in place for 7 years and has been amenorrheic during that time.  She endorses hot flashes for multiple years.  Her IUD was placed initially for abnormal uterine bleeding.  The plan with her gynecologist is to keep it in place through menopause unless she starts to have bleeding.  She endorses normal bowel function although occasionally oscillates between constipation and diarrhea.  Has never been diagnosed with IBS.  Denies urinary symptoms other than stress urinary incontinence.  She endorses a good appetite without nausea or emesis.  Medical history is notable for rheumatoid arthritis, is on multiple medications for this including immunosuppressant medications.  She continues to endorse tobacco use, is currently smoking about half a pack a day.  She is wearing a heart monitor, sees a cardiologist in [place].  She turns in her heart monitor on Thursday.  This is for work-up of palpitations.  PAST MEDICAL HISTORY:  Past Medical History:  Diagnosis Date   Arrhythmia    rapid heart rate - wearing a cardiac monitor   History of recurrent UTI (urinary tract infection)    Hypertension    Rheumatic fever    Rheumatoid arthritis ([place])      PAST SURGICAL HISTORY:  Past Surgical History:  Procedure Laterality Date   APPENDECTOMY     DILATION AND CURETTAGE OF UTERUS     x2, after miscarriages   PLACEMENT OF BREAST IMPLANTS     TUBAL LIGATION      OB/GYN HISTORY:  OB History  Gravida Para Term Preterm AB Living  4 2       2   SAB IAB Ectopic Multiple Live Births               # Outcome Date GA Lbr Len/2nd Weight Sex Delivery Anes PTL Lv  4 Gravida           3 Gravida           2 Para           1 Para              No LMP recorded. (Menstrual status: IUD).  Age at menarche: 33 Age at menopause: Menses stopped at approximately 24 although this was secondary to having the Mirena IUD.  Patient notes multiple years of having hot flashes Hx of HRT: Denies Hx of STDs: HPV Last pap: See HPI History of abnormal pap smears: Yes, see HPI  SCREENING STUDIES:  Last mammogram: 2020  Last colonoscopy: Has never had  MEDICATIONS: Outpatient Encounter Medications as of 04/18/2022  Medication Sig   albuterol (VENTOLIN HFA) 108 (90 Base) MCG/ACT inhaler Inhale into the lungs.   amphetamine-dextroamphetamine (ADDERALL) 20 MG tablet Take by mouth.   Cholecalciferol 1.25 MG (50000 UT) TABS Take 1 tablet by mouth daily.   cyanocobalamin ([employer] VITAMIN B12) 1000 MCG tablet Place under the tongue.   ENBREL SURECLICK 50 MG/ML injection Inject into the skin.   EPINEPHrine 0.3 mg/0.3 mL IJ SOAJ injection Inject into the muscle.   folic acid (FOLVITE) 1 MG tablet Take 2 mg by mouth daily.   furosemide (LASIX) 20 MG tablet Take 20 mg by mouth daily.   ibuprofen (ADVIL) 800 MG tablet Take 1 tablet by  mouth every 6 (six) hours as needed.   ketoconazole (NIZORAL) 2 % shampoo APPLY TOPICALLY TWICE A WEEK   leflunomide (ARAVA) 20 MG tablet Take 20 mg by mouth daily.   levonorgestrel (MIRENA) 20 MCG/DAY IUD by Intrauterine route.   lisinopril-hydrochlorothiazide (ZESTORETIC) 10-12.5 MG tablet Take 1 tablet by mouth daily.   loratadine (CLARITIN) 10 MG tablet Take by mouth.   meloxicam (MOBIC) 15 MG tablet Take 1 tablet (15 mg total) by mouth daily.   methylPREDNISolone (MEDROL DOSEPAK) 4 MG TBPK tablet Take by mouth as directed.   metoprolol succinate (TOPROL-XL) 100 MG 24 hr tablet Take 100 mg by mouth daily.   metoprolol succinate (TOPROL-XL) 25 MG 24 hr tablet Take by mouth.   omeprazole (PRILOSEC) 20 MG capsule Take 20 mg by mouth daily.   oxyCODONE (ROXICODONE) 15 MG immediate release tablet Take 15 mg by mouth every 4  (four) hours as needed for pain. For pain   Prenatal Vit-Min-FA-Fish Oil ([employer] PRENATAL GUMMY PO) Take 1 tablet by mouth daily.   tiZANidine (ZANAFLEX) 4 MG tablet Take 4 mg by mouth 2 (two) times daily as needed.   [DISCONTINUED] methylPREDNIsolone (MEDROL DOSPACK) 4 MG tablet follow package directions   [DISCONTINUED] cefpodoxime (VANTIN) 200 MG tablet Take 1 tablet (200 mg total) by mouth 2 (two) times daily.   No facility-administered encounter medications on file as of 04/18/2022.    ALLERGIES:  Allergies  Allergen Reactions   Hornet Venom Swelling   Penicillins Rash, Itching and Other (See Comments)    "any 'cillins" "any 'cillins" Other reaction(s): Other (See Comments), rash "any 'cillins" "any 'cillins"    Upadacitinib Hives   Latex Itching and Rash    Other reaction(s): HIVES, Other (See Comments)    Topiramate Other (See Comments)    Vivid dreams and insomnia Vivid dreams and insomnia Other reaction(s): Other Vivid dreams and insomnia Vivid dreams and insomnia      FAMILY HISTORY:  Family History  Problem Relation Age of Onset   Breast cancer Maternal Grandmother    Colon cancer Paternal Uncle      SOCIAL HISTORY:  Social Connections: Not on file    REVIEW OF SYSTEMS:  + mouth sores, pain with intercourse, hot flashes Denies appetite changes, fevers, chills, fatigue, unexplained weight changes. Denies hearing loss, neck lumps or masses, ringing in ears or voice changes. Denies cough or wheezing.  Denies shortness of breath. Denies chest pain or palpitations. Denies leg swelling. Denies abdominal distention, pain, blood in stools, constipation, diarrhea, nausea, vomiting, or early satiety. Denies dysuria, frequency, hematuria or incontinence. Denies pelvic pain, vaginal bleeding or vaginal discharge.   Denies joint pain, back pain or muscle pain/cramps. Denies itching, rash, or wounds. Denies dizziness, headaches, numbness or seizures. Denies swollen  lymph nodes or glands, denies easy bruising or bleeding. Denies anxiety, depression, confusion, or decreased concentration.  Physical Exam:  Vital Signs for this encounter:  Pulse 98, temperature 98.7 F (37.1 C), temperature source Oral, resp. rate 16, height 5\' 6"  (1.676 m), weight 203 lb 6.4 oz (92.3 kg), SpO2 97 %. Body mass index is 32.83 kg/m. General: Alert, oriented, no acute distress.  HEENT: Normocephalic, atraumatic. Sclera anicteric.  Chest: Clear to auscultation bilaterally. No wheezes, rhonchi, or rales. Cardiovascular: Regular rate and rhythm, no murmurs, rubs, or gallops.  Abdomen: Obese. Normoactive bowel sounds. Soft, nondistended, nontender to palpation. No masses or hepatosplenomegaly appreciated. No palpable fluid wave.  Extremities: Grossly normal range of motion. Warm, well perfused. No edema  bilaterally.  Skin: No rashes or lesions.  Lymphatics: No cervical, supraclavicular, or inguinal adenopathy.  GU: External female genitalia notable for hypopigmentation along the perineum.  There is 1 approximately 1 cm polypoid and condylomatous looking lesion along the perineum and a couple smaller, 1-3 mm condylomatous lesions just at the opening of the vagina.  Along the right medial labia minora, there is a conglomeration of 4 5 condylomatous lesions measuring approximately 2 x 2 cm.  No other condylomatous lesions or skin changes noted on the vulva.  On speculum exam, vagina is well rugated, no lesions noted.  Cervix is healing well after recent excisional procedure.  Bimanual exam, no cervical masses or nodularity appreciated.  Uterus is small and mobile.  LABORATORY AND RADIOLOGIC DATA:  Outside medical records were reviewed to synthesize the above history, along with the history and physical obtained during the visit.   Lab Results  Component Value Date   WBC 21.3 (H) 12/21/2012   HGB 14.5 12/21/2012   HCT 41.5 12/21/2012   PLT 253 12/21/2012   GLUCOSE 136 (H)  12/21/2012   ALT 18 12/21/2012   AST 17 12/21/2012   NA 135 12/21/2012   K 3.8 12/21/2012   CL 98 12/21/2012   CREATININE 0.75 12/21/2012   BUN 12 12/21/2012   CO2 24 12/21/2012

## 2022-04-18 NOTE — Progress Notes (Unsigned)
GYNECOLOGIC ONCOLOGY NEW PATIENT CONSULTATION   Patient Name: Briana Jackson  Patient Age: 51 y.o. 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  We discussed her history of cervical and now vulvar dysplasia.  We discussed the role of HPV in the pathogenesis of the majority of cervical, vulvar and vaginal dysplasia.   With regard to her low grade cervical dysplasia, given the low risk of progression to cancer, this will be followed with close surveillance (cotesting in one year). With regard to her high-grade vulvar dysplasia, given increased risk of progression to cancer, I recommend proceeding with treatment. We discussed options for surgical treatment (excision and laser ablation) as well as topical therapy (eg 5FU).  Given multifocal disease, I recommended combination of excision and laser ablation.  I think the right medial labial minora conglomeration of condylomatous lesions is amenable for resection.  The areas along the perineum and posterior fourchette are better served by laser ablation.   We also discussed the risk of recurrence after treatment and the need for long-term surveillance with her OBGYN.    The patient has rheumatoid arthritis and is on medications for this that can cause immunosuppression.  Additionally, she has a long history and current history of tobacco use.  We reviewed that cigarette smoking is a cofactor in the development of vulvar cancer.  I encouraged consideration of decreasing her tobacco use or quitting.   Plan will be for EUA, possible vulvar biopsies, vulvar laser ablation, partial simple vulvectomy, and any other indicated  procedures. We discussed risks of surgery which included but are not limited to bleeding, need for blood transfusion, infection, damage to surrounding structure, anesthesia risk thromboembolic events, unforeseen complications, and medical complications. The patient will receive DVT and antibiotic prophylaxis as indicated. She voiced a clear understanding. She had the opportunity to ask questions. Perioperative instructions were reviewed with her. Prescriptions for post-op medications were sent to her pharmacy of choice.  A copy of this note was sent to the patient's referring provider.   65 minutes of total time was spent for this patient encounter, including preparation, face-to-face counseling with the patient and coordination of care, and documentation of the encounter.   Eugene Garnet, MD  Division of Gynecologic Oncology  Department of Obstetrics and Gynecology  [facility]  ___________________________________________  Chief Complaint: Chief Complaint  Patient presents with   VAIN II (vaginal intraepithelial neoplasia grade II)    History of Present Illness:  Briana Jackson is a 51 y.o. y.o. female who is seen in consultation at the request of Ernestina Penna, MD for an evaluation of high-grade vulvar dysplasia.  Patient reports a history of abnormal Pap smears.  She underwent a LEEP procedure approximately 20 years ago at [facility] for cervical dysplasia.  She reports normal Pap smears until this year.  Pap test was performed 12/02/2021 revealing L SIL.  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  Cervical biopsy with fragments of benign squamous mucosa.  Pathology revealed low-grade squamous intraepithelial lesion of the cervix, endocervical margin focally involved, ectocervical margin not involved.  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   Since surgery, the patient  notes having  prolonged healing.  She had some issues where the condyloma were removed along the perineum.  This feels like it has now healed although she describes having some scar tissue in the area.  Patient denies any vaginal bleeding or discharge.  She has had a Mirena IUD in place for 7 years and has been amenorrheic during that time.  She endorses hot flashes for multiple years.  Her IUD was placed initially for abnormal uterine bleeding.  The plan with her gynecologist is to keep it in place through menopause unless she starts to have bleeding.  She endorses normal bowel function although occasionally oscillates between constipation and diarrhea.  Has never been diagnosed with IBS.  Denies urinary symptoms other than stress urinary incontinence.  She endorses a good appetite without nausea or emesis.  Medical history is notable for rheumatoid arthritis, is on multiple medications for this including immunosuppressant medications.  She continues to endorse tobacco use, is currently smoking about half a pack a day.  She is wearing a heart monitor, sees a cardiologist in [place].  She turns in her heart monitor on Thursday.  This is for work-up of palpitations.  PAST MEDICAL HISTORY:  Past Medical History:  Diagnosis Date   Arrhythmia    rapid heart rate - wearing a cardiac monitor   History of recurrent UTI (urinary tract infection)    Hypertension    Rheumatic fever    Rheumatoid arthritis ([place])      PAST SURGICAL HISTORY:  Past Surgical History:  Procedure Laterality Date   APPENDECTOMY     DILATION AND CURETTAGE OF UTERUS     x2, after miscarriages   PLACEMENT OF BREAST IMPLANTS     TUBAL LIGATION      OB/GYN HISTORY:  OB History  Gravida Para Term Preterm AB Living  4 2       2   SAB IAB Ectopic Multiple Live Births               # Outcome Date GA Lbr Len/2nd Weight Sex Delivery Anes PTL Lv  4 Gravida           3 Gravida           2 Para           1 Para              No LMP recorded. (Menstrual status: IUD).  Age at menarche: 11 Age at menopause: Menses stopped at approximately 51 although this was secondary to having the Mirena IUD.  Patient notes multiple years of having hot flashes Hx of HRT: Denies Hx of STDs: HPV Last pap: See HPI History of abnormal pap smears: Yes, see HPI  SCREENING STUDIES:  Last mammogram: 2020  Last colonoscopy: Has never had  MEDICATIONS: Outpatient Encounter Medications as of 04/18/2022  Medication Sig   albuterol (VENTOLIN HFA) 108 (90 Base) MCG/ACT inhaler Inhale into the lungs.   amphetamine-dextroamphetamine (ADDERALL) 20 MG tablet Take by mouth.   Cholecalciferol 1.25 MG (50000 UT) TABS Take 1 tablet by mouth daily.   cyanocobalamin ([employer] VITAMIN B12) 1000 MCG tablet Place under the tongue.   ENBREL SURECLICK 50 MG/ML injection Inject into the skin.   EPINEPHrine 0.3 mg/0.3 mL IJ SOAJ injection Inject into the muscle.   folic acid (FOLVITE) 1 MG tablet Take 2 mg by mouth daily.   furosemide (LASIX) 20 MG tablet Take 20 mg by mouth daily.   ibuprofen (ADVIL) 800 MG tablet Take 1 tablet by  mouth every 6 (six) hours as needed.   ketoconazole (NIZORAL) 2 % shampoo APPLY TOPICALLY TWICE A WEEK   leflunomide (ARAVA) 20 MG tablet Take 20 mg by mouth daily.   levonorgestrel (MIRENA) 20 MCG/DAY IUD by Intrauterine route.   lisinopril-hydrochlorothiazide (ZESTORETIC) 10-12.5 MG tablet Take 1 tablet by mouth daily.   loratadine (CLARITIN) 10 MG tablet Take by mouth.   meloxicam (MOBIC) 15 MG tablet Take 1 tablet (15 mg total) by mouth daily.   methylPREDNISolone (MEDROL DOSEPAK) 4 MG TBPK tablet Take by mouth as directed.   metoprolol succinate (TOPROL-XL) 100 MG 24 hr tablet Take 100 mg by mouth daily.   metoprolol succinate (TOPROL-XL) 25 MG 24 hr tablet Take by mouth.   omeprazole (PRILOSEC) 20 MG capsule Take 20 mg by mouth daily.   oxyCODONE (ROXICODONE) 15 MG immediate release tablet Take 15 mg by mouth every 4  (four) hours as needed for pain. For pain   Prenatal Vit-Min-FA-Fish Oil ([employer]S PRENATAL GUMMY PO) Take 1 tablet by mouth daily.   tiZANidine (ZANAFLEX) 4 MG tablet Take 4 mg by mouth 2 (two) times daily as needed.   [DISCONTINUED] methylPREDNIsolone (MEDROL DOSPACK) 4 MG tablet follow package directions   [DISCONTINUED] cefpodoxime (VANTIN) 200 MG tablet Take 1 tablet (200 mg total) by mouth 2 (two) times daily.   No facility-administered encounter medications on file as of 04/18/2022.    ALLERGIES:  Allergies  Allergen Reactions   Hornet Venom Swelling   Penicillins Rash, Itching and Other (See Comments)    "any 'cillins" "any 'cillins" Other reaction(s): Other (See Comments), rash "any 'cillins" "any 'cillins"    Upadacitinib Hives   Latex Itching and Rash    Other reaction(s): HIVES, Other (See Comments)    Topiramate Other (See Comments)    Vivid dreams and insomnia Vivid dreams and insomnia Other reaction(s): Other Vivid dreams and insomnia Vivid dreams and insomnia      FAMILY HISTORY:  Family History  Problem Relation Age of Onset   Breast cancer Maternal Grandmother    Colon cancer Paternal Uncle      SOCIAL HISTORY:  Social Connections: Not on file    REVIEW OF SYSTEMS:  + mouth sores, pain with intercourse, hot flashes Denies appetite changes, fevers, chills, fatigue, unexplained weight changes. Denies hearing loss, neck lumps or masses, ringing in ears or voice changes. Denies cough or wheezing.  Denies shortness of breath. Denies chest pain or palpitations. Denies leg swelling. Denies abdominal distention, pain, blood in stools, constipation, diarrhea, nausea, vomiting, or early satiety. Denies dysuria, frequency, hematuria or incontinence. Denies pelvic pain, vaginal bleeding or vaginal discharge.   Denies joint pain, back pain or muscle pain/cramps. Denies itching, rash, or wounds. Denies dizziness, headaches, numbness or seizures. Denies swollen  lymph nodes or glands, denies easy bruising or bleeding. Denies anxiety, depression, confusion, or decreased concentration.  Physical Exam:  Vital Signs for this encounter:  Pulse 98, temperature 98.7 F (37.1 C), temperature source Oral, resp. rate 16, height 5\' 6"  (1.676 m), weight 203 lb 6.4 oz (92.3 kg), SpO2 97 %. Body mass index is 32.83 kg/m. General: Alert, oriented, no acute distress.  HEENT: Normocephalic, atraumatic. Sclera anicteric.  Chest: Clear to auscultation bilaterally. No wheezes, rhonchi, or rales. Cardiovascular: Regular rate and rhythm, no murmurs, rubs, or gallops.  Abdomen: Obese. Normoactive bowel sounds. Soft, nondistended, nontender to palpation. No masses or hepatosplenomegaly appreciated. No palpable fluid wave.  Extremities: Grossly normal range of motion. Warm, well perfused. No edema  bilaterally.  Skin: No rashes or lesions.  Lymphatics: No cervical, supraclavicular, or inguinal adenopathy.  GU: External female genitalia notable for hypopigmentation along the perineum.  There is 1 approximately 1 cm polypoid and condylomatous looking lesion along the perineum and a couple smaller, 1-3 mm condylomatous lesions just at the opening of the vagina.  Along the right medial labia minora, there is a conglomeration of 4 5 condylomatous lesions measuring approximately 2 x 2 cm.  No other condylomatous lesions or skin changes noted on the vulva.  On speculum exam, vagina is well rugated, no lesions noted.  Cervix is healing well after recent excisional procedure.  Bimanual exam, no cervical masses or nodularity appreciated.  Uterus is small and mobile.  LABORATORY AND RADIOLOGIC DATA:  Outside medical records were reviewed to synthesize the above history, along with the history and physical obtained during the visit.   Lab Results  Component Value Date   WBC 21.3 (H) 12/21/2012   HGB 14.5 12/21/2012   HCT 41.5 12/21/2012   PLT 253 12/21/2012   GLUCOSE 136 (H)  12/21/2012   ALT 18 12/21/2012   AST 17 12/21/2012   NA 135 12/21/2012   K 3.8 12/21/2012   CL 98 12/21/2012   CREATININE 0.75 12/21/2012   BUN 12 12/21/2012   CO2 24 12/21/2012

## 2022-04-18 NOTE — Progress Notes (Signed)
Patient here for new patient consultation with Dr. Eugene Garnet and for a pre-operative discussion prior to her scheduled surgery on May 04, 2022. She is scheduled for examination under anesthesia, vulvar biopsies, vulvar laser, and vulvar excision. The surgery was discussed in detail.  See after visit summary for additional details.    Discussed post-op pain management in detail including the aspects of the enhanced recovery pathway.  Advised her that a new prescription would be sent in for oxycodone for her to use for breakthrough pain and it is only to be used for after her upcoming surgery. She is chronically on oxycodone.  We discussed the use of tylenol post-op and to monitor for a maximum of 4,000 mg in a 24 hour period.  Also prescribed sennakot to be used after surgery and to hold if having loose stools.  Discussed bowel regimen in detail.     Discussed the use of SCDs and measures to take at home to prevent DVT including frequent mobility.  Reportable signs and symptoms of DVT discussed. Post-operative instructions discussed and expectations for after surgery. Incisional care discussed as well including reportable signs and symptoms including erythema, drainage, wound separation.     5 minutes spent with the patient.  Verbalizing understanding of material discussed. No needs or concerns voiced at the end of the visit.   Advised patient to call for any needs.  Advised that her post-operative medications had been prescribed and could be picked up at any time.    This appointment is included in the global surgical bundle as pre-operative teaching and has no charge.

## 2022-04-18 NOTE — Patient Instructions (Signed)
Preparing for your Surgery  Plan for surgery on May 04, 2022 with Dr. Eugene Garnet at [facility]. You will be scheduled for examination under anesthesia, vulvar biopsies, vulvar laser, and vulvar excision.   Pre-operative Testing -You will receive a phone call from presurgical testing at [facility] to discuss surgery instructions and arrange for lab work if needed.  -Bring your insurance card, copy of an advanced directive if applicable, medication list.  -You should not be taking blood thinners or aspirin at least ten days prior to surgery unless instructed by your surgeon.  -Do not take supplements such as fish oil (omega 3), red yeast rice, turmeric before your surgery. You want to avoid medications with aspirin in them including headache powders such as BC or Goody's), Excedrin migraine.  Day Before Surgery at Home -You will be advised you can have clear liquids up until 3 hours before your surgery.    Your role in recovery Your role is to become active as soon as directed by your doctor, while still giving yourself time to heal.  Rest when you feel tired. You will be asked to do the following in order to speed your recovery:  - Cough and breathe deeply. This helps to clear and expand your lungs and can prevent pneumonia after surgery.  - STAY ACTIVE WHEN YOU GET HOME. Do mild physical activity. Walking or moving your legs help your circulation and body functions return to normal. Do not try to get up or walk alone the first time after surgery.   -If you develop swelling on one leg or the other, pain in the back of your leg, redness/warmth in one of your legs, please call the office or go to the Emergency Room to have a doppler to rule out a blood clot. For shortness of breath, chest pain-seek care in the Emergency Room as soon as possible. - Actively manage your pain. Managing your pain lets you move in comfort. We will ask you to rate your pain on a scale  of zero to 10. It is your responsibility to tell your doctor or nurse where and how much you hurt so your pain can be treated.  Special Considerations -Your final pathology results from surgery should be available around one week after surgery and the results will be relayed to you when available.  -FMLA forms can be faxed to [phone] and please allow 5-7 business days for completion.  Pain Management After Surgery -You will be prescribed your pain medication and bowel regimen medications before surgery so that you can have these available when you are discharged from the hospital. The pain medication is for use ONLY AFTER surgery and a new prescription will not be given.   -Make sure that you have Tylenol and Ibuprofen (OR MOBIC (MELOXICAM), DO NOT TAKE TOGETHER SINCE THEY WORK SIMILARLY) at home IF YOU ARE ABLE TO TAKE THESE MEDICATIONS to use on a regular basis after surgery for pain control. We recommend alternating the medications every hour to six hours since they work differently and are processed in the body differently for pain relief.  -Review the attached handout on narcotic use and their risks and side effects.   Bowel Regimen -You will be prescribed Sennakot-S to take nightly to prevent constipation especially if you are taking the narcotic pain medication intermittently.  It is important to prevent constipation and drink adequate amounts of liquids. You can stop taking this medication when you are not taking pain medication  and you are back on your normal bowel routine.  Risks of Surgery Risks of surgery are low but include bleeding, infection, damage to surrounding structures, re-operation, blood clots, and very rarely death.  AFTER SURGERY INSTRUCTIONS  Return to work:  2-3 weeks if applicable  We recommend purchasing several bags of frozen green peas and dividing them into ziploc bags. You will want to keep these in the freezer and have them ready to use as ice packs to  the vulvar incision. Once the ice pack is no longer cold, you can get another from the freezer. The frozen peas mold to your body better than a regular ice pack.   Activity: 1. Be up and out of the bed during the day.  Take a nap if needed.  You may walk up steps but be careful and use the hand rail.  Stair climbing will tire you more than you think, you may need to stop part way and rest.   2. No lifting or straining for 4 weeks over 10 pounds MINIMUM. No pushing, pulling, straining for 4 weeks.  3. No driving for minimum 24 hours after surgery, usually for 3-5 days if you feel you can brake safely.  Do not drive if you are taking narcotic pain medicine and make sure that your reaction time has returned.   4. You can shower as soon as the next day after surgery. Shower daily. No tub baths or submerging your body in water until cleared by your surgeon. If you have the soap that was given to you by pre-surgical testing that was used before surgery, you do not need to use it afterwards because this can irritate your incisions.   5. No sexual activity and nothing in the vagina for 4 weeks MINIMUM.  6. You may experience vaginal spotting and discharge after surgery.  The spotting is normal but if you experience heavy bleeding, call our office.  7. Take Tylenol or ibuprofen (OR MOBIC (MELOXICAM)) first for pain if you are able to take these medications and only use narcotic pain medication for severe pain not relieved by the Tylenol or Ibuprofen.  Monitor your Tylenol intake to a max of 4,000 mg in a 24 hour period. You can alternate these medications after surgery.  Diet: 1. Low sodium Heart Healthy Diet is recommended but you are cleared to resume your normal (before surgery) diet after your procedure.  2. It is safe to use a laxative, such as Miralax or Colace, if you have difficulty moving your bowels. You have been prescribed Sennakot at bedtime every evening to keep bowel movements regular and  to prevent constipation.    Wound Care: 1. Keep clean and dry.  Shower daily.  Reasons to call the Doctor: Fever - Oral temperature greater than 100.4 degrees Fahrenheit Foul-smelling vaginal discharge Difficulty urinating Nausea and vomiting Increased pain at the site of the incision that is unrelieved with pain medicine. Difficulty breathing with or without chest pain New calf pain especially if only on one side Sudden, continuing increased vaginal bleeding with or without clots.   Contacts: For questions or concerns you should contact:  Dr. Eugene Garnet at [phone]  Warner Mccreedy, NP at [phone]  After Hours: call [phone] and have the GYN Oncologist paged/contacted (after 5 pm or on the weekends).  Messages sent via mychart are for non-urgent matters and are not responded to after hours so for urgent needs, please call the after hours number.

## 2022-04-18 NOTE — Patient Instructions (Signed)
Preparing for your Surgery  Plan for surgery on May 04, 2022 with Dr. Katherine Tucker at [facility]. You will be scheduled for examination under anesthesia, vulvar biopsies, vulvar laser, and vulvar excision.   Pre-operative Testing -You will receive a phone call from presurgical testing at [facility] to discuss surgery instructions and arrange for lab work if needed.  -Bring your insurance card, copy of an advanced directive if applicable, medication list.  -You should not be taking blood thinners or aspirin at least ten days prior to surgery unless instructed by your surgeon.  -Do not take supplements such as fish oil (omega 3), red yeast rice, turmeric before your surgery. You want to avoid medications with aspirin in them including headache powders such as BC or Goody's), Excedrin migraine.  Day Before Surgery at Home -You will be advised you can have clear liquids up until 3 hours before your surgery.    Your role in recovery Your role is to become active as soon as directed by your doctor, while still giving yourself time to heal.  Rest when you feel tired. You will be asked to do the following in order to speed your recovery:  - Cough and breathe deeply. This helps to clear and expand your lungs and can prevent pneumonia after surgery.  - STAY ACTIVE WHEN YOU GET HOME. Do mild physical activity. Walking or moving your legs help your circulation and body functions return to normal. Do not try to get up or walk alone the first time after surgery.   -If you develop swelling on one leg or the other, pain in the back of your leg, redness/warmth in one of your legs, please call the office or go to the Emergency Room to have a doppler to rule out a blood clot. For shortness of breath, chest pain-seek care in the Emergency Room as soon as possible. - Actively manage your pain. Managing your pain lets you move in comfort. We will ask you to rate your pain on a scale  of zero to 10. It is your responsibility to tell your doctor or nurse where and how much you hurt so your pain can be treated.  Special Considerations -Your final pathology results from surgery should be available around one week after surgery and the results will be relayed to you when available.  -FMLA forms can be faxed to [phone] and please allow 5-7 business days for completion.  Pain Management After Surgery -You will be prescribed your pain medication and bowel regimen medications before surgery so that you can have these available when you are discharged from the hospital. The pain medication is for use ONLY AFTER surgery and a new prescription will not be given.   -Make sure that you have Tylenol and Ibuprofen (OR MOBIC (MELOXICAM), DO NOT TAKE TOGETHER SINCE THEY WORK SIMILARLY) at home IF YOU ARE ABLE TO TAKE THESE MEDICATIONS to use on a regular basis after surgery for pain control. We recommend alternating the medications every hour to six hours since they work differently and are processed in the body differently for pain relief.  -Review the attached handout on narcotic use and their risks and side effects.   Bowel Regimen -You will be prescribed Sennakot-S to take nightly to prevent constipation especially if you are taking the narcotic pain medication intermittently.  It is important to prevent constipation and drink adequate amounts of liquids. You can stop taking this medication when you are not taking pain medication   and you are back on your normal bowel routine.  Risks of Surgery Risks of surgery are low but include bleeding, infection, damage to surrounding structures, re-operation, blood clots, and very rarely death.  AFTER SURGERY INSTRUCTIONS  Return to work:  2-3 weeks if applicable  We recommend purchasing several bags of frozen green peas and dividing them into ziploc bags. You will want to keep these in the freezer and have them ready to use as ice packs to  the vulvar incision. Once the ice pack is no longer cold, you can get another from the freezer. The frozen peas mold to your body better than a regular ice pack.   Activity: 1. Be up and out of the bed during the day.  Take a nap if needed.  You may walk up steps but be careful and use the hand rail.  Stair climbing will tire you more than you think, you may need to stop part way and rest.   2. No lifting or straining for 4 weeks over 10 pounds MINIMUM. No pushing, pulling, straining for 4 weeks.  3. No driving for minimum 24 hours after surgery, usually for 3-5 days if you feel you can brake safely.  Do not drive if you are taking narcotic pain medicine and make sure that your reaction time has returned.   4. You can shower as soon as the next day after surgery. Shower daily. No tub baths or submerging your body in water until cleared by your surgeon. If you have the soap that was given to you by pre-surgical testing that was used before surgery, you do not need to use it afterwards because this can irritate your incisions.   5. No sexual activity and nothing in the vagina for 4 weeks MINIMUM.  6. You may experience vaginal spotting and discharge after surgery.  The spotting is normal but if you experience heavy bleeding, call our office.  7. Take Tylenol or ibuprofen (OR MOBIC (MELOXICAM)) first for pain if you are able to take these medications and only use narcotic pain medication for severe pain not relieved by the Tylenol or Ibuprofen.  Monitor your Tylenol intake to a max of 4,000 mg in a 24 hour period. You can alternate these medications after surgery.  Diet: 1. Low sodium Heart Healthy Diet is recommended but you are cleared to resume your normal (before surgery) diet after your procedure.  2. It is safe to use a laxative, such as Miralax or Colace, if you have difficulty moving your bowels. You have been prescribed Sennakot at bedtime every evening to keep bowel movements regular and  to prevent constipation.    Wound Care: 1. Keep clean and dry.  Shower daily.  Reasons to call the Doctor: Fever - Oral temperature greater than 100.4 degrees Fahrenheit Foul-smelling vaginal discharge Difficulty urinating Nausea and vomiting Increased pain at the site of the incision that is unrelieved with pain medicine. Difficulty breathing with or without chest pain New calf pain especially if only on one side Sudden, continuing increased vaginal bleeding with or without clots.   Contacts: For questions or concerns you should contact:  Dr. Katherine Tucker at [phone]  Sidnee Gambrill, NP at [phone]  After Hours: call [phone] and have the GYN Oncologist paged/contacted (after 5 pm or on the weekends).  Messages sent via mychart are for non-urgent matters and are not responded to after hours so for urgent needs, please call the after hours number.        Messages sent via mychart are for non-urgent matters and are not responded to after hours so for urgent needs, please call the after hours number.

## 2022-04-19 DIAGNOSIS — D849 Immunodeficiency, unspecified: Secondary | ICD-10-CM | POA: Insufficient documentation

## 2022-04-19 DIAGNOSIS — Z6832 Body mass index (BMI) 32.0-32.9, adult: Secondary | ICD-10-CM | POA: Insufficient documentation

## 2022-04-19 DIAGNOSIS — N891 Moderate vaginal dysplasia: Secondary | ICD-10-CM | POA: Insufficient documentation

## 2022-04-19 DIAGNOSIS — N870 Mild cervical dysplasia: Secondary | ICD-10-CM | POA: Insufficient documentation

## 2022-04-25 ENCOUNTER — Other Ambulatory Visit: Payer: Self-pay | Admitting: Gynecologic Oncology

## 2022-04-25 DIAGNOSIS — N903 Dysplasia of vulva, unspecified: Secondary | ICD-10-CM

## 2022-04-29 ENCOUNTER — Encounter (HOSPITAL_BASED_OUTPATIENT_CLINIC_OR_DEPARTMENT_OTHER): Payer: Self-pay | Admitting: Gynecologic Oncology

## 2022-04-29 ENCOUNTER — Telehealth: Payer: Self-pay | Admitting: *Deleted

## 2022-04-29 NOTE — Telephone Encounter (Signed)
Patient called and stated "I have a stress test scheduled with the cardiologist on 8/8 at 9 am with Dr. Natasha Bence. Smitty Cords)   Melissa, APP notified

## 2022-04-29 NOTE — Telephone Encounter (Signed)
Spoke with pt today to inform her that we can reschedule her sx date to June 14, 2022 because she needs to have a stress test to be cleared by cardiology. She stated that will not work with her FMLA. She is going to call her cardiology office and try to get that appt moved up. Informed her to call them then give [place] a call back. She verbalized understanding.

## 2022-04-29 NOTE — Progress Notes (Addendum)
Addendum:  called and spoke w/ Warner Mccreedy NP and requested cardiac clearance to be faxed from Dr Pricilla Holm office. Received and placed on chart.  Addendum:  Chart reviewed w/ anesthesia, Dr Bradley Ferris MDA, via phone.  Dr Bradley Ferris MDA stated pt will need to complete cardiolgist evaluation (event monitor complete but pt has nuclear stress test scheduled for 05-26-2022) prior to surgery.  Called and spoke w/ Dr Pricilla Holm informed her of recommendation, stated she will reach to pt and reschedule.  Addendum:   pt verbalized understanding surgery time change ,  arrive at 1000 and npo after mn with exception clear liquids until 0900.  ADDENDUM:  Received EKG result but not 12 lead ekg tracing.  Pt will need ekg dos.  Spoke w/ via phone for pre-op interview--- pt Lab needs dos----  [facility] results------ pt has a current 12 lead ekg done by her pcp, dated 04-11-2022, called and requested this to be faxed.  It not received pt will need one dos COVID test -----patient states asymptomatic no test needed Arrive at ------- 0815 on 05-04-2022 NPO after MN NO Solid Food.  Clear liquids from MN until--- 0715 Med rec completed Medications to take morning of surgery ----- claritin, toprol, prilosec, arava Diabetic medication ----- n/a Patient instructed no nail polish to be worn day of surgery Patient instructed to bring photo id and insurance card day of surgery Patient aware to have Driver (ride ) / caregiver   for 24 hours after surgery -- sig other, bradley Patient Special Instructions ----- n/a Pre-Op special Istructions ----- n/a Patient verbalized understanding of instructions that were given at this phone interview. Patient denies shortness of breath, chest pain, fever, cough at this phone interview.    Anesthesia Review: HTN;  RA;  pt is currently being evaluated by cardiologist, Dr Marisue Brooklyn ([facility] [place]), lov in epic 04-18-2022.  For palpitations and precordial pain.    Event monitor completed in care everywhere 04-25-2022 (NSR , 1 episode SVT 3 beats and no sustain atrial or ventral arrhythmia . Per pt she is scheduled for cardiolite 05-26-2022.  Pt stated chest pain is once in a while, last time few days ago when her grandson was born , lasted few seconds.  Stated palpitations her intermittent.  PCP:  Criss Alvine NP (lov 04-11-2022 epic) Cardiologist : Dr Eugenie Norrie Theron Arista 04-18-2022 epic Rheumatology:  Dr Bea Laura. Semble  (in [place] EKG :  04-11-2022 done at pcp office ,  called / requested to be faxed Echo :  11-12-2020  [facility] Stress test:  scheduled for 05-26-2022 Cardiac Cath : no Activity level:  denies sob w/ any activities  Blood Thinner/ Instructions /Last Dose: no ASA / Instructions/ Last Dose :  ASA 81mg /  per pt was given instructions by Dr to not take day before surgery and morning of surgery, last dose to be Monday 05-02-2022

## 2022-04-29 NOTE — Telephone Encounter (Signed)
Attempted to call pt to reschedule her surgery date but unable to reach pt.  Unable to LVM for a return call.

## 2022-05-02 ENCOUNTER — Telehealth: Payer: Self-pay | Admitting: *Deleted

## 2022-05-02 NOTE — Telephone Encounter (Signed)
Spoke with pt this morning who stated her stress test is 05/03/22 in the morning with a stat read.

## 2022-05-02 NOTE — Telephone Encounter (Signed)
Attempted to speak with pt this morning to follow up from the stress test. Unable to reach pt. LVM for return call.

## 2022-05-03 ENCOUNTER — Telehealth: Payer: Self-pay

## 2022-05-03 NOTE — Telephone Encounter (Signed)
I spoke to Briana Jackson today regarding her stress test from this morning, She states everything went well and the results are in her MyChart.  Pt aware I will reach back out to her Cardiologist to get a copy of the report and to get clearance for surgery tomorrow. .  Stress test received from cardiology, clearance for surgery has been received as well.    Patient compliant with pre-operative instructions.  Reinforced nothing to eat after midnight. Clear liquids until 9"00am. Patient to arrive at 10:00 for surgery at 12:00.  No questions or concerns voiced.  Instructed to call for any needs.

## 2022-05-03 NOTE — Telephone Encounter (Signed)
Pt aware stress test received and surgical clearance given by her cardiologist.

## 2022-05-03 NOTE — Telephone Encounter (Signed)
[place], from Dr. Providence Lanius office called stating she has relayed my message to him regarding needing a copy of the STAT read (they will fax) and needing surgical clearance.

## 2022-05-03 NOTE — Telephone Encounter (Signed)
I LVM for patient to call office regarding how her stress test went this morning.   I also called and left a message with her cardiologist, Dr. Natasha Bence. ([facility]), to get the STAT read on pt's stress test. Pt is having surgery tomorrow and we need clearance.

## 2022-05-04 ENCOUNTER — Encounter (HOSPITAL_BASED_OUTPATIENT_CLINIC_OR_DEPARTMENT_OTHER): Payer: Self-pay | Admitting: Gynecologic Oncology

## 2022-05-04 ENCOUNTER — Ambulatory Visit (HOSPITAL_BASED_OUTPATIENT_CLINIC_OR_DEPARTMENT_OTHER)
Admission: RE | Admit: 2022-05-04 | Discharge: 2022-05-04 | Disposition: A | Discharge status: Home / Self Care | Payer: BC Managed Care – PPO | Attending: Gynecologic Oncology | Admitting: Gynecologic Oncology

## 2022-05-04 ENCOUNTER — Encounter (HOSPITAL_BASED_OUTPATIENT_CLINIC_OR_DEPARTMENT_OTHER)
Admission: RE | Disposition: A | Discharge status: Home / Self Care | Payer: Self-pay | Source: Home / Self Care | Attending: Gynecologic Oncology

## 2022-05-04 ENCOUNTER — Telehealth: Payer: Self-pay | Admitting: *Deleted

## 2022-05-04 ENCOUNTER — Ambulatory Visit (HOSPITAL_BASED_OUTPATIENT_CLINIC_OR_DEPARTMENT_OTHER): Payer: BC Managed Care – PPO | Admitting: Anesthesiology

## 2022-05-04 ENCOUNTER — Other Ambulatory Visit: Payer: Self-pay

## 2022-05-04 DIAGNOSIS — N903 Dysplasia of vulva, unspecified: Secondary | ICD-10-CM

## 2022-05-04 DIAGNOSIS — M069 Rheumatoid arthritis, unspecified: Secondary | ICD-10-CM | POA: Diagnosis not present

## 2022-05-04 DIAGNOSIS — I739 Peripheral vascular disease, unspecified: Secondary | ICD-10-CM | POA: Diagnosis not present

## 2022-05-04 DIAGNOSIS — F17210 Nicotine dependence, cigarettes, uncomplicated: Secondary | ICD-10-CM | POA: Insufficient documentation

## 2022-05-04 DIAGNOSIS — Z01818 Encounter for other preprocedural examination: Secondary | ICD-10-CM

## 2022-05-04 DIAGNOSIS — K219 Gastro-esophageal reflux disease without esophagitis: Secondary | ICD-10-CM | POA: Diagnosis not present

## 2022-05-04 DIAGNOSIS — I10 Essential (primary) hypertension: Secondary | ICD-10-CM | POA: Insufficient documentation

## 2022-05-04 DIAGNOSIS — Z79899 Other long term (current) drug therapy: Secondary | ICD-10-CM | POA: Insufficient documentation

## 2022-05-04 DIAGNOSIS — N901 Moderate vulvar dysplasia: Secondary | ICD-10-CM | POA: Diagnosis present

## 2022-05-04 HISTORY — DX: Personal history of other diseases of the digestive system: Z87.19

## 2022-05-04 HISTORY — DX: Precordial pain: R07.2

## 2022-05-04 HISTORY — DX: Low back pain, unspecified: M54.50

## 2022-05-04 HISTORY — DX: Personal history of cervical dysplasia: Z87.410

## 2022-05-04 HISTORY — PX: CO2 LASER APPLICATION: SHX5778

## 2022-05-04 HISTORY — DX: Personal history of other infectious and parasitic diseases: Z86.19

## 2022-05-04 HISTORY — DX: Dysplasia of vulva, unspecified: N90.3

## 2022-05-04 HISTORY — PX: VULVECTOMY: SHX1086

## 2022-05-04 HISTORY — DX: Gastro-esophageal reflux disease without esophagitis: K21.9

## 2022-05-04 HISTORY — DX: Personal history of other diseases of the circulatory system: Z86.79

## 2022-05-04 HISTORY — DX: Other chronic pain: G89.29

## 2022-05-04 HISTORY — DX: Attention-deficit hyperactivity disorder, unspecified type: F90.9

## 2022-05-04 HISTORY — DX: Major depressive disorder, single episode, unspecified: F32.9

## 2022-05-04 HISTORY — DX: Palpitations: R00.2

## 2022-05-04 HISTORY — DX: Personal history of vaginal dysplasia: Z87.411

## 2022-05-04 LAB — POCT I-STAT, CHEM 8
BUN: 12 mg/dL (ref 6–20)
Calcium, Ion: 1.07 mmol/L — ABNORMAL LOW (ref 1.15–1.40)
Chloride: 102 mmol/L (ref 98–111)
Creatinine, Ser: 0.6 mg/dL (ref 0.44–1.00)
Glucose, Bld: 103 mg/dL — ABNORMAL HIGH (ref 70–99)
HCT: 43 % (ref 36.0–46.0)
Hemoglobin: 14.6 g/dL (ref 12.0–15.0)
Potassium: 4.1 mmol/L (ref 3.5–5.1)
Sodium: 140 mmol/L (ref 135–145)
TCO2: 29 mmol/L (ref 22–32)

## 2022-05-04 SURGERY — VULVECTOMY
Anesthesia: General | Site: Vulva

## 2022-05-04 MED ORDER — ACETAMINOPHEN 500 MG PO TABS
ORAL_TABLET | ORAL | Status: AC
  Filled 2022-05-04: qty 2

## 2022-05-04 MED ORDER — MIDAZOLAM HCL 2 MG/2ML IJ SOLN
INTRAMUSCULAR | Status: AC
  Filled 2022-05-04: qty 2

## 2022-05-04 MED ORDER — OXYCODONE HCL 5 MG/5ML PO SOLN: 5.0000 mg | Freq: Once | ORAL | Status: DC | PRN

## 2022-05-04 MED ORDER — KETOROLAC TROMETHAMINE 30 MG/ML IJ SOLN
INTRAMUSCULAR | Status: DC | PRN
  Administered 2022-05-04: 30 mg via INTRAVENOUS

## 2022-05-04 MED ORDER — KETOROLAC TROMETHAMINE 15 MG/ML IJ SOLN: 15.0000 mg | INTRAMUSCULAR | Status: DC

## 2022-05-04 MED ORDER — SILVER SULFADIAZINE 1 % EX CREA
TOPICAL_CREAM | CUTANEOUS | Status: DC | PRN
  Administered 2022-05-04: 1 via TOPICAL

## 2022-05-04 MED ORDER — GABAPENTIN 300 MG PO CAPS
300.0000 mg | ORAL_CAPSULE | ORAL | Status: AC
  Administered 2022-05-04: 300 mg via ORAL

## 2022-05-04 MED ORDER — LIDOCAINE HCL (PF) 2 % IJ SOLN
INTRAMUSCULAR | Status: AC
  Filled 2022-05-04: qty 5

## 2022-05-04 MED ORDER — DEXAMETHASONE SODIUM PHOSPHATE 10 MG/ML IJ SOLN: 4.0000 mg | INTRAMUSCULAR | Status: DC

## 2022-05-04 MED ORDER — DEXAMETHASONE SODIUM PHOSPHATE 4 MG/ML IJ SOLN
INTRAMUSCULAR | Status: DC | PRN
  Administered 2022-05-04: 5 mg via INTRAVENOUS

## 2022-05-04 MED ORDER — AMISULPRIDE (ANTIEMETIC) 5 MG/2ML IV SOLN: 10.0000 mg | Freq: Once | INTRAVENOUS | Status: DC | PRN

## 2022-05-04 MED ORDER — PROMETHAZINE HCL 25 MG/ML IJ SOLN: 6.2500 mg | INTRAMUSCULAR | Status: DC | PRN

## 2022-05-04 MED ORDER — FENTANYL CITRATE (PF) 100 MCG/2ML IJ SOLN
INTRAMUSCULAR | Status: AC
  Filled 2022-05-04: qty 2

## 2022-05-04 MED ORDER — ONDANSETRON HCL 4 MG/2ML IJ SOLN
INTRAMUSCULAR | Status: DC | PRN
  Administered 2022-05-04: 4 mg via INTRAVENOUS

## 2022-05-04 MED ORDER — PROPOFOL 10 MG/ML IV BOLUS
INTRAVENOUS | Status: AC
  Filled 2022-05-04: qty 20

## 2022-05-04 MED ORDER — LIDOCAINE 2% (20 MG/ML) 5 ML SYRINGE
INTRAMUSCULAR | Status: DC | PRN
  Administered 2022-05-04: 60 mg via INTRAVENOUS

## 2022-05-04 MED ORDER — OXYCODONE HCL 5 MG PO TABS: 5.0000 mg | ORAL_TABLET | Freq: Once | ORAL | Status: DC | PRN

## 2022-05-04 MED ORDER — GABAPENTIN 300 MG PO CAPS
ORAL_CAPSULE | ORAL | Status: AC
  Filled 2022-05-04: qty 1

## 2022-05-04 MED ORDER — PROPOFOL 10 MG/ML IV BOLUS
INTRAVENOUS | Status: DC | PRN
  Administered 2022-05-04: 200 mg via INTRAVENOUS

## 2022-05-04 MED ORDER — ACETIC ACID 5 % SOLN
Status: DC | PRN
  Administered 2022-05-04: 1 via TOPICAL

## 2022-05-04 MED ORDER — LIDOCAINE HCL 1 % IJ SOLN
INTRAMUSCULAR | Status: DC | PRN
  Administered 2022-05-04: 13 mL

## 2022-05-04 MED ORDER — ACETAMINOPHEN 500 MG PO TABS
1000.0000 mg | ORAL_TABLET | ORAL | Status: AC
  Administered 2022-05-04: 1000 mg via ORAL

## 2022-05-04 MED ORDER — SCOPOLAMINE 1 MG/3DAYS TD PT72
1.0000 | MEDICATED_PATCH | TRANSDERMAL | Status: DC
  Administered 2022-05-04: 1.5 mg via TRANSDERMAL

## 2022-05-04 MED ORDER — MEPERIDINE HCL 25 MG/ML IJ SOLN: 6.2500 mg | INTRAMUSCULAR | Status: DC | PRN

## 2022-05-04 MED ORDER — ONDANSETRON HCL 4 MG/2ML IJ SOLN
INTRAMUSCULAR | Status: AC
  Filled 2022-05-04: qty 2

## 2022-05-04 MED ORDER — LACTATED RINGERS IV SOLN: INTRAVENOUS | Status: DC

## 2022-05-04 MED ORDER — MIDAZOLAM HCL 5 MG/5ML IJ SOLN
INTRAMUSCULAR | Status: DC | PRN
  Administered 2022-05-04: 2 mg via INTRAVENOUS

## 2022-05-04 MED ORDER — FENTANYL CITRATE (PF) 100 MCG/2ML IJ SOLN
INTRAMUSCULAR | Status: DC | PRN
Start: 2022-05-04 — End: 2022-05-04
  Administered 2022-05-04: 50 ug via INTRAVENOUS
  Administered 2022-05-04: 25 ug via INTRAVENOUS
  Administered 2022-05-04: 50 ug via INTRAVENOUS
  Administered 2022-05-04: 25 ug via INTRAVENOUS
  Administered 2022-05-04: 50 ug via INTRAVENOUS

## 2022-05-04 MED ORDER — DEXAMETHASONE SODIUM PHOSPHATE 10 MG/ML IJ SOLN
INTRAMUSCULAR | Status: AC
  Filled 2022-05-04: qty 1

## 2022-05-04 MED ORDER — SCOPOLAMINE 1 MG/3DAYS TD PT72
MEDICATED_PATCH | TRANSDERMAL | Status: AC
  Filled 2022-05-04: qty 1

## 2022-05-04 SURGICAL SUPPLY — 41 items
BLADE CLIPPER SENSICLIP SURGIC (BLADE) IMPLANT
BLADE SURG 15 STRL LF DISP TIS (BLADE) ×2 IMPLANT
BLADE SURG 15 STRL SS (BLADE) ×2
CANISTER SUCT 1200ML W/VALVE (MISCELLANEOUS) IMPLANT
CATH ROBINSON RED A/P 16FR (CATHETERS) ×3 IMPLANT
DEPRESSOR TONGUE BLADE STERILE (MISCELLANEOUS) ×3 IMPLANT
DRSG TELFA 3X8 NADH (GAUZE/BANDAGES/DRESSINGS) IMPLANT
ELECT BALL LEEP 3MM BLK (ELECTRODE) IMPLANT
GAUZE 4X4 16PLY ~~LOC~~+RFID DBL (SPONGE) ×6 IMPLANT
GLOVE BIO SURGEON STRL SZ 6 (GLOVE) ×6 IMPLANT
GOWN STRL REUS W/TWL LRG LVL3 (GOWN DISPOSABLE) ×3 IMPLANT
KIT TURNOVER CYSTO (KITS) ×3 IMPLANT
NDL HYPO 25X1 1.5 SAFETY (NEEDLE) ×2 IMPLANT
NEEDLE HYPO 25X1 1.5 SAFETY (NEEDLE) ×2 IMPLANT
NS IRRIG 500ML POUR BTL (IV SOLUTION) ×3 IMPLANT
PACK PERINEAL COLD (PAD) ×3 IMPLANT
PACK VAGINAL MINOR WOMEN LF (CUSTOM PROCEDURE TRAY) ×3 IMPLANT
PACK VAGINAL WOMENS (CUSTOM PROCEDURE TRAY) ×3 IMPLANT
PAD DRESSING TELFA 3X8 NADH (GAUZE/BANDAGES/DRESSINGS) IMPLANT
PAD PREP 24X48 CUFFED NSTRL (MISCELLANEOUS) ×3 IMPLANT
PENCIL BUTTON HOLSTER BLD 10FT (ELECTRODE) ×3 IMPLANT
PUNCH BIOPSY DERMAL 3 (INSTRUMENTS) IMPLANT
PUNCH BIOPSY DERMAL 3MM (INSTRUMENTS)
PUNCH BIOPSY DERMAL 4MM (INSTRUMENTS) IMPLANT
SUT VIC AB 0 CT1 36 (SUTURE) ×1 IMPLANT
SUT VIC AB 0 SH 27 (SUTURE) ×3 IMPLANT
SUT VIC AB 2-0 CT2 27 (SUTURE) IMPLANT
SUT VIC AB 2-0 SH 27 (SUTURE) ×2
SUT VIC AB 2-0 SH 27XBRD (SUTURE) IMPLANT
SUT VIC AB 3-0 PS2 18 (SUTURE) ×2
SUT VIC AB 3-0 PS2 18XBRD (SUTURE) IMPLANT
SUT VIC AB 3-0 SH 27 (SUTURE) ×2
SUT VIC AB 3-0 SH 27X BRD (SUTURE) ×2 IMPLANT
SUT VIC AB 4-0 PS2 18 (SUTURE) ×4 IMPLANT
SUT VICRYL 0 UR6 27IN ABS (SUTURE) IMPLANT
SWAB OB GYN 8IN STERILE 2PK (MISCELLANEOUS) ×6 IMPLANT
SYR BULB IRRIG 60ML STRL (SYRINGE) ×3 IMPLANT
TOWEL OR 17X26 10 PK STRL BLUE (TOWEL DISPOSABLE) ×3 IMPLANT
TUBE CONNECTING 12X1/4 (SUCTIONS) IMPLANT
VACUUM HOSE 7/8X10 W/ WAND (MISCELLANEOUS) IMPLANT
WATER STERILE IRR 500ML POUR (IV SOLUTION) ×3 IMPLANT

## 2022-05-04 NOTE — Anesthesia Postprocedure Evaluation (Signed)
Anesthesia Post Note  Patient: Briana Jackson  Procedure(s) Performed: VULVECTOMY (Vulva) CO2 LASER APPLICATION VULVA (Vulva)     Patient location during evaluation: PACU Anesthesia Type: General Level of consciousness: awake and alert Pain management: pain level controlled Vital Signs Assessment: post-procedure vital signs reviewed and stable Respiratory status: spontaneous breathing, nonlabored ventilation and respiratory function stable Cardiovascular status: blood pressure returned to baseline and stable Postop Assessment: no apparent nausea or vomiting Anesthetic complications: no   No notable events documented.  Last Vitals:  Vitals:   05/04/22 1315 05/04/22 1356  BP: 113/76 136/88  Pulse: 84 88  Resp: (!) 9 15  Temp:  36.6 C  SpO2: 94% 96%    Last Pain:  Vitals:   05/04/22 1356  TempSrc:   PainSc: 4                  Lowella Curb

## 2022-05-04 NOTE — Interval H&P Note (Signed)
History and Physical Interval Note:  05/04/2022 11:47 AM  Briana Jackson  has presented today for surgery, with the diagnosis of VULVAR DYSPLASIA.  The various methods of treatment have been discussed with the patient and family. After consideration of risks, benefits and other options for treatment, the patient has consented to  Procedure(s): VULVECTOMY (N/A) CO2 LASER APPLICATION VULVA (N/A) as a surgical intervention.  The patient's history has been reviewed, patient examined, no change in status, stable for surgery.  I have reviewed the patient's chart and labs.  Questions were answered to the patient's satisfaction.     Carver Fila

## 2022-05-04 NOTE — Anesthesia Preprocedure Evaluation (Signed)
Anesthesia Evaluation  Patient identified by MRN, date of birth, ID band Patient awake    Reviewed: Allergy & Precautions, NPO status , Patient's Chart, lab work & pertinent test results  Airway Mallampati: II  TM Distance: >3 FB Neck ROM: Full    Dental no notable dental hx.    Pulmonary neg pulmonary ROS, Current Smoker and Patient abstained from smoking.,    Pulmonary exam normal breath sounds clear to auscultation       Cardiovascular hypertension, Pt. on medications + Peripheral Vascular Disease  Normal cardiovascular exam Rhythm:Regular Rate:Normal     Neuro/Psych  Headaches, Depression negative psych ROS   GI/Hepatic Neg liver ROS, GERD  ,  Endo/Other  negative endocrine ROS  Renal/GU negative Renal ROS  negative genitourinary   Musculoskeletal  (+) Arthritis , Osteoarthritis,    Abdominal (+) + obese,   Peds negative pediatric ROS (+)  Hematology negative hematology ROS (+)   Anesthesia Other Findings   Reproductive/Obstetrics negative OB ROS                             Anesthesia Physical Anesthesia Plan  ASA: 2  Anesthesia Plan: General   Post-op Pain Management: Tylenol PO (pre-op)* and Gabapentin PO (pre-op)*   Induction: Intravenous  PONV Risk Score and Plan: 2 and Ondansetron, Midazolam and Treatment may vary due to age or medical condition  Airway Management Planned: LMA  Additional Equipment:   Intra-op Plan:   Post-operative Plan: Extubation in OR  Informed Consent: I have reviewed the patients History and Physical, chart, labs and discussed the procedure including the risks, benefits and alternatives for the proposed anesthesia with the patient or authorized representative who has indicated his/her understanding and acceptance.     Dental advisory given  Plan Discussed with: CRNA  Anesthesia Plan Comments:         Anesthesia Quick  Evaluation

## 2022-05-04 NOTE — Telephone Encounter (Signed)
Fax FMLA 

## 2022-05-04 NOTE — Discharge Instructions (Addendum)
AFTER SURGERY INSTRUCTIONS   Return to work:  2-3 weeks if applicable   We recommend purchasing several bags of frozen green peas and dividing them into ziploc bags. You will want to keep these in the freezer and have them ready to use as ice packs to the vulvar incision. Once the ice pack is no longer cold, you can get another from the freezer. The frozen peas mold to your body better than a regular ice pack.  You can apply the silvadene cream provided to the vulva twice a day to help with healing.    Activity: 1. Be up and out of the bed during the day.  Take a nap if needed.  You may walk up steps but be careful and use the hand rail.  Stair climbing will tire you more than you think, you may need to stop part way and rest.    2. No lifting or straining for 4 weeks over 10 pounds MINIMUM. No pushing, pulling, straining for 4 weeks.   3. No driving for minimum 24 hours after surgery, usually for 3-5 days if you feel you can brake safely.  Do not drive if you are taking narcotic pain medicine and make sure that your reaction time has returned.    4. You can shower as soon as the next day after surgery. Shower daily. No tub baths or submerging your body in water until cleared by your surgeon. If you have the soap that was given to you by pre-surgical testing that was used before surgery, you do not need to use it afterwards because this can irritate your incisions.    5. No sexual activity and nothing in the vagina for 4 weeks MINIMUM.   6. You may experience vaginal spotting and discharge after surgery.  The spotting is normal but if you experience heavy bleeding, call our office.   7. Take Tylenol or ibuprofen (OR MOBIC (MELOXICAM)) first for pain if you are able to take these medications and only use narcotic pain medication for severe pain not relieved by the Tylenol or Ibuprofen.  Monitor your Tylenol intake to a max of 4,000 mg in a 24 hour period. You can alternate these medications after  surgery.   Diet: 1. Low sodium Heart Healthy Diet is recommended but you are cleared to resume your normal (before surgery) diet after your procedure.   2. It is safe to use a laxative, such as Miralax or Colace, if you have difficulty moving your bowels. You have been prescribed Sennakot at bedtime every evening to keep bowel movements regular and to prevent constipation.     Wound Care: 1. Keep clean and dry.  Shower daily.   Reasons to call the Doctor: Fever - Oral temperature greater than 100.4 degrees Fahrenheit Foul-smelling vaginal discharge Difficulty urinating Nausea and vomiting Increased pain at the site of the incision that is unrelieved with pain medicine. Difficulty breathing with or without chest pain New calf pain especially if only on one side Sudden, continuing increased vaginal bleeding with or without clots.   Contacts: For questions or concerns you should contact:   Dr. Eugene Garnet at [phone]   Warner Mccreedy, NP at [phone]   After Hours: call [phone] and have the GYN Oncologist paged/contacted (after 5 pm or on the weekends).   Messages sent via mychart are for non-urgent matters and are not responded to after hours so for urgent needs, please call the after hours number.     Post Anesthesia  Home Care Instructions  Activity: Get plenty of rest for the remainder of the day. A responsible individual must stay with you for 24 hours following the procedure.  For the next 24 hours, DO NOT: -Drive a car -Advertising copywriter -Drink alcoholic beverages -Take any medication unless instructed by your physician -Make any legal decisions or sign important papers.  Meals: Start with liquid foods such as gelatin or soup. Progress to regular foods as tolerated. Avoid greasy, spicy, heavy foods. If nausea and/or vomiting occur, drink only clear liquids until the nausea and/or vomiting subsides. Call your physician if vomiting continues.  Special  Instructions/Symptoms: Your throat may feel dry or sore from the anesthesia or the breathing tube placed in your throat during surgery. If this causes discomfort, gargle with warm salt water. The discomfort should disappear within 24 hours.  If you had a scopolamine patch placed behind your ear for the management of post- operative nausea and/or vomiting:  1. The medication in the patch is effective for 72 hours, after which it should be removed.  Wrap patch in a tissue and discard in the trash. Wash hands thoroughly with soap and water. 2. You may remove the patch earlier than 72 hours if you experience unpleasant side effects which may include dry mouth, dizziness or visual disturbances. 3. Avoid touching the patch. Wash your hands with soap and water after contact with the patch.  You may remove the scopolamine patch on or before Saturday 05/07/22  No acetaminophen/Tylenol until after 4:40 pm today if needed.   No ibuprofen, Advil, Aleve, Motrin, ketorolac, meloxicam, naproxen, or other NSAIDS until after 6:40 pm today if needed.

## 2022-05-04 NOTE — Op Note (Signed)
PATIENT: Briana Jackson  ENCOUNTER DATE: 05/04/22  Preop Diagnosis: Vulvar dysplasia  Postoperative Diagnosis: Same as above  Surgery: Right partial vulvectomy, CO2 laser of the vulva  Surgeons:  Carin Hock, MD  Assistant: Warner Mccreedy NP  Anesthesia: General   Estimated blood loss: 25 ml  IVF:  see I&O flowsheet   Urine output: n/a   Complications: None apparent  Pathology: right anterior vulva, stitch at 12 o'c; lateral margin from 9 to 12 o'c  Operative findings: Right along medial aspect of labia minora with multiple 3-75mm condylomatous lesions measuring a total of 2cm (excised). Several 2-4 mm condylomatous lesions along posterior fourchette (all lasered). No additional condylomatous lesions noted, no acetowhite changes after application of acetic acid.   Procedure: The patient was identified in the preoperative holding area. Informed consent was signed on the chart. Patient was seen history was reviewed and exam was performed.   The patient was then taken to the operating room and placed in the supine position with SCD hose on. General anesthesia was then induced without difficulty. She was then placed in the dorsolithotomy position. The perineum was prepped with Betadine. The vagina was prepped with Betadine. The patient was then draped after the prep was dried.   Timeout was performed the patient, procedure, antibiotic, allergy, and length of procedure. 5% acetic acid solution was applied to the perineum. The vulvar tissues were inspected for areas of acetowhite changes or leukoplakia. The lesion was identified and the marking pen was used to circumscribe the area with appropriate surgical margins. The subcuticular tissues were infiltrated with 1% lidocaine.   The 15 blade scalpel was used to make an incision through the skin circumferentially as marked. The skin elipse was grasped and was separated from the underlying deep dermal tissues with the bovie device. After the  specimen had been completely resected, it was oriented and marked at 12 o'clock with a 0-vicryl suture. A lateral margin was taken from the 9 to 12 o'clock margin. The bovie was used to obtain hemostasis at the surgical bed. The subcutaneous tissues were irrigated and made hemostatic.   The deep dermal layer was approximated with 2-0 and 3-0 vicryl mattress sutures to bring the skin edges into approximation and off tension. The wound was closed following langher's lines. The cutaneous layer was closed with interrupted 4-0 vicryl stitches and mattress sutures to ensure a tension free and hemostatic closure. The perineum was again irrigated.   The patient's surgical field was draped with wet towels. The staff and patient ensured laser-safe eyewear and masks were fitted. The laser was set to 8 watts continuous. The laser was tested for accuracy on a tongue depressor.  Local anesthesia was injected in the subdermal tissue of the planned areas for laser. The laser was applied to the circumscribed area of the vulva that had been previously identified. The tissue was ablated to the desired depth and the eschar was removed with a moistened sponge. When the entire lesion had been ablated the procedure was complete.  Silvadine cream was applied to the laser site.  All instrument, suture, laparotomy, Ray-Tec, and needle counts were correct x2. The patient tolerated the procedure well and was taken recovery room in stable condition.   Eugene Garnet MD Gynecologic Oncology

## 2022-05-04 NOTE — Transfer of Care (Signed)
Immediate Anesthesia Transfer of Care Note  Patient: Briana Jackson  Procedure(s) Performed: Procedure(s) (LRB): VULVECTOMY (N/A) CO2 LASER APPLICATION VULVA (N/A)  Patient Location: PACU  Anesthesia Type: General  Level of Consciousness: awake, sedated, patient cooperative and responds to stimulation  Airway & Oxygen Therapy: Patient Spontanous Breathing and Patient connected to [place] oxygen  Post-op Assessment: Report given to PACU RN, Post -op Vital signs reviewed and stable and Patient moving all extremities  Post vital signs: Reviewed and stable  Complications: No apparent anesthesia complications

## 2022-05-04 NOTE — Anesthesia Procedure Notes (Signed)
Procedure Name: LMA Insertion Date/Time: 05/04/2022 12:06 PM  Performed by: Jessica Priest, CRNAPre-anesthesia Checklist: Patient identified, Emergency Drugs available, Suction available, Patient being monitored and Timeout performed Patient Re-evaluated:Patient Re-evaluated prior to induction Oxygen Delivery Method: Circle system utilized Preoxygenation: Pre-oxygenation with 100% oxygen Induction Type: IV induction Ventilation: Mask ventilation without difficulty LMA: LMA inserted LMA Size: 4.0 Number of attempts: 1 Airway Equipment and Method: Bite block Placement Confirmation: positive ETCO2, breath sounds checked- equal and bilateral and CO2 detector Tube secured with: Tape Dental Injury: Teeth and Oropharynx as per pre-operative assessment

## 2022-05-05 ENCOUNTER — Encounter (HOSPITAL_BASED_OUTPATIENT_CLINIC_OR_DEPARTMENT_OTHER): Payer: Self-pay | Admitting: Gynecologic Oncology

## 2022-05-05 ENCOUNTER — Telehealth: Payer: Self-pay

## 2022-05-05 NOTE — Telephone Encounter (Signed)
Spoke with Ms. Milillo this morning. She states she is eating, drinking and urinating well. She has not had a BM yet but is passing gas. She is taking senokot as prescribed and encouraged her to drink plenty of water. She denies fever or chills. Incisions are dry and intact. She rates her pain 3/10. Her pain is controlled with tylenol, ibuprofen, and Oxycodone ~every 6-8 hrs.    Instructed to call office with any fever, chills, purulent drainage, uncontrolled pain or any other questions or concerns. Patient verbalizes understanding.   Pt aware of post op appointments as well as the office number [phone] and after hours number [phone] to call if she has any questions or concerns

## 2022-05-06 LAB — SURGICAL PATHOLOGY

## 2022-05-09 ENCOUNTER — Other Ambulatory Visit: Payer: Self-pay | Admitting: Gynecologic Oncology

## 2022-05-09 DIAGNOSIS — N903 Dysplasia of vulva, unspecified: Secondary | ICD-10-CM

## 2022-05-09 DIAGNOSIS — N891 Moderate vaginal dysplasia: Secondary | ICD-10-CM

## 2022-05-09 MED ORDER — OXYCODONE HCL 5 MG PO TABS: 5.0000 mg | ORAL_TABLET | Freq: Four times a day (QID) | ORAL | 0 refills | Status: DC | PRN

## 2022-05-09 NOTE — Progress Notes (Signed)
Called patient with pathology results.  Patient overall happy with this news.  Asked if small additional prescription for 5 mg oxycodone can be sent in.  5 additional tablets sent to her pharmacy.  Eugene Garnet MD Gynecologic Oncology

## 2022-06-08 ENCOUNTER — Encounter: Payer: Self-pay | Admitting: Gynecologic Oncology

## 2022-06-10 ENCOUNTER — Encounter: Payer: Self-pay | Admitting: Gynecologic Oncology

## 2022-06-10 ENCOUNTER — Inpatient Hospital Stay: Payer: BC Managed Care – PPO | Attending: Gynecologic Oncology | Admitting: Gynecologic Oncology

## 2022-06-10 ENCOUNTER — Other Ambulatory Visit: Payer: Self-pay

## 2022-06-10 VITALS — BP 149/91 | HR 91 | Temp 98.3°F | Resp 16 | Ht 65.98 in | Wt 199.0 lb

## 2022-06-10 DIAGNOSIS — B379 Candidiasis, unspecified: Secondary | ICD-10-CM

## 2022-06-10 DIAGNOSIS — N891 Moderate vaginal dysplasia: Secondary | ICD-10-CM

## 2022-06-10 MED ORDER — FLUCONAZOLE 150 MG PO TABS: 150.0000 mg | ORAL_TABLET | Freq: Every day | ORAL | 0 refills | Status: AC

## 2022-06-10 NOTE — Patient Instructions (Addendum)
It was good to see you today.  You are healing well from surgery.  Remember, no heavy lifting for 6 weeks after surgery.  I will see you back for follow-up in 6 weeks to make sure continued healing.  I have sent in a one-time dose of a prescription to treat your yeast infection.  Please call if your symptoms do not improve.

## 2022-06-10 NOTE — Progress Notes (Signed)
Gynecologic Oncology Return Clinic Visit  06/10/22  Reason for Visit: Follow-up after surgery, treatment planning  Treatment History: 05/04/22: Right partial vulvectomy, CO2 laser of the vulva  Interval History: Has some continued irritation especially on the right side.  Thinks her incision at least partially opened.  Has had some discharge.  Bleeding has mostly stopped although was having intermittent spotting from her incision.  Reports regular bowel and bladder function.  Past Medical/Surgical History: Past Medical History:  Diagnosis Date   ADHD (attention deficit hyperactivity disorder)    Arrhythmia    rapid heart rate - wearing a cardiac monitor   Chronic low back pain    GERD (gastroesophageal reflux disease)    H/O: rheumatic fever    childhood   History of cervical dysplasia    History of condyloma acuminatum    genital area/ perinum/ anal   History of diverticulitis of colon    History of Helicobacter pylori infection 03/2019   treated   History of recurrent UTI (urinary tract infection)    History of vaginal dysplasia    Hypertension    MDD (major depressive disorder)    Palpitations    04-29-2022  intermittanly,  had event monitor done by cardiology 04-25-2022 results in care everywhere   Precordial pain    Renal artery stenosis (HCC) 10/2020   per imagaing in care everywhere 11-12-2020 bilateral renal artery stenosis <60%   Rheumatoid arthritis [facility])    rheumatology--- dr Virgel Manifold;  multiple sites   Vulvar dysplasia     Past Surgical History:  Procedure Laterality Date   APPENDECTOMY     1990s   CERVICAL BIOPSY  W/ LOOP ELECTRODE EXCISION  2003   @[facility]    CERVICAL CONIZATION W/BX  01/10/2022   @[facility] by dr n. 01/12/2022;   AND RE-EXCISION MULTIPLE CONDYLOMATA PERINEUM TO ANAL   CO2 LASER APPLICATION N/A 05/04/2022   Procedure: CO2 LASER APPLICATION VULVA;  Surgeon: Ralph Dowdy, MD;  Location: [facility];  Service: Gynecology;   Laterality: N/A;   DILATION AND CURETTAGE OF UTERUS     x2,for missed ab,  last one 1996   PLACEMENT OF BREAST IMPLANTS Bilateral 2013   TUBAL LIGATION Bilateral    yrs ago   VULVECTOMY N/A 05/04/2022   Procedure: VULVECTOMY;  Surgeon: 2014, MD;  Location: [facility];  Service: Gynecology;  Laterality: N/A;    Family History  Problem Relation Age of Onset   Breast cancer Maternal Grandmother    Colon cancer Paternal Uncle     Social History   Socioeconomic History   Marital status: Divorced    Spouse name: Not on file   Number of children: Not on file   Years of education: Not on file   Highest education level: Not on file  Occupational History   Not on file  Tobacco Use   Smoking status: Every Day    Packs/day: 0.50    Types: Cigarettes   Smokeless tobacco: Never  Vaping Use   Vaping Use: Never used  Substance and Sexual Activity   Alcohol use: Not Currently   Drug use: Never   Sexual activity: Yes    Birth control/protection: I.U.D., Surgical  Other Topics Concern   Not on file  Social History Narrative   Not on file   Social Determinants of Health   Financial Resource Strain: Not on file  Food Insecurity: Not on file  Transportation Needs: Not on file  Physical Activity: Not  on file  Stress: Not on file  Social Connections: Not on file    Current Medications:  Current Outpatient Medications:    albuterol (VENTOLIN HFA) 108 (90 Base) MCG/ACT inhaler, Inhale 2 puffs into the lungs every 6 (six) hours as needed for shortness of breath or wheezing., Disp: , Rfl:    aspirin EC 81 MG tablet, Take 81 mg by mouth daily. Swallow whole., Disp: , Rfl:    cyanocobalamin ([employer] VITAMIN B12) 1000 MCG tablet, Place under the tongue., Disp: , Rfl:    ENBREL SURECLICK 50 MG/ML injection, Inject 50 mg into the skin once a week. Thursday's, Disp: , Rfl:    EPINEPHrine 0.3 mg/0.3 mL IJ SOAJ injection, Inject 0.3 mg into the muscle as needed for  anaphylaxis., Disp: , Rfl:    fluconazole (DIFLUCAN) 150 MG tablet, Take 1 tablet (150 mg total) by mouth daily., Disp: 1 tablet, Rfl: 0   folic acid (FOLVITE) 1 MG tablet, Take 2 mg by mouth daily., Disp: , Rfl:    furosemide (LASIX) 20 MG tablet, Take 20 mg by mouth daily., Disp: , Rfl:    ibuprofen (ADVIL) 800 MG tablet, Take 1 tablet by mouth every 6 (six) hours as needed., Disp: , Rfl:    ketoconazole (NIZORAL) 2 % shampoo, APPLY TOPICALLY TWICE A WEEK, Disp: , Rfl:    leflunomide (ARAVA) 20 MG tablet, Take 20 mg by mouth daily., Disp: , Rfl:    levonorgestrel (MIRENA) 20 MCG/DAY IUD, 1 each by Intrauterine route once., Disp: , Rfl:    lisinopril-hydrochlorothiazide (ZESTORETIC) 20-25 MG tablet, Take 1 tablet by mouth at bedtime., Disp: , Rfl:    loratadine (CLARITIN) 10 MG tablet, Take 10 mg by mouth daily., Disp: , Rfl:    metoprolol succinate (TOPROL-XL) 100 MG 24 hr tablet, Take 100 mg by mouth at bedtime., Disp: , Rfl:    omeprazole (PRILOSEC) 20 MG capsule, Take 20 mg by mouth daily., Disp: , Rfl:    oxyCODONE (ROXICODONE) 15 MG immediate release tablet, Take 15 mg by mouth every 4 (four) hours as needed for pain. For pain, Disp: , Rfl:    Prenatal Vit-Min-FA-Fish Oil ([employer] PRENATAL GUMMY PO), Take 1 tablet by mouth daily., Disp: , Rfl:    tiZANidine (ZANAFLEX) 4 MG tablet, Take 4 mg by mouth 2 (two) times daily as needed for muscle spasms., Disp: , Rfl:   Review of Systems: + bleeding, discharge, itching Denies appetite changes, fevers, chills, fatigue, unexplained weight changes. Denies hearing loss, neck lumps or masses, mouth sores, ringing in ears or voice changes. Denies cough or wheezing.  Denies shortness of breath. Denies chest pain or palpitations. Denies leg swelling. Denies abdominal distention, pain, blood in stools, constipation, diarrhea, nausea, vomiting, or early satiety. Denies pain with intercourse, dysuria, frequency, hematuria or incontinence. Denies hot  flashes, pelvic pain.   Denies joint pain, back pain or muscle pain/cramps. Denies rash or wounds. Denies dizziness, headaches, numbness or seizures. Denies swollen lymph nodes or glands, denies easy bruising or bleeding. Denies anxiety, depression, confusion, or decreased concentration.  Physical Exam: BP (!) 149/91 (BP Location: Left Arm, Patient Position: Sitting)   Pulse 91   Temp 98.3 F (36.8 C) (Oral)   Resp 16   Ht 5' 5.98" (1.676 m)   Wt 199 lb (90.3 kg)   SpO2 97%   BMI 32.14 kg/m  General: Alert, oriented, no acute distress. HEENT: Normocephalic, atraumatic, sclera anicteric. Chest: Unlabored breathing on room air.  GU: Right vulvar incision  opened completely, superficial, mildly erythematous but no exudate or induration.  Laser sites are healing well.  There is some white, thicker discharge within the vagina and on the perineum consistent with yeast.  Laboratory & Radiologic Studies: A. 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  - VIN-1 (koilocytic change) focally involves 3:00 margin.  - Remaining margins negative for dysplasia.  - Negative for invasive carcinoma.   B. LATERAL MARGIN, 9 TO 12 OCLOCK, EXCISION:  - Benign vulvar skin.  - Negative for dysplasia.   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  Patient is overall doing well postoperatively.  Reviewed postoperative expectations and restrictions.  Gust pathology from surgery.  Patient was given a copy of her pathology report.  Focal high-grade dysplasia, margins negative for high-grade dysplasia.  Focal area of the 3:00 margin had low-grade koilocytic changes.  We will discuss recommendations for surveillance at her next visit.  Given slower healing than expected, I will see her back in 6 weeks.  From exam findings, I think she has a yeast infection.  I sent  a one-time dose of Diflucan to her pharmacy.  I have asked her to start doing sitz bath more regularly.  18 minutes of total time was spent for this patient encounter, including preparation, face-to-face counseling with the patient and coordination of care, and documentation of the encounter.  Jeral Pinch, MD  Division of Gynecologic Oncology  Department of Obstetrics and Gynecology  [facility]

## 2022-06-17 ENCOUNTER — Telehealth: Payer: Self-pay | Admitting: *Deleted

## 2022-06-17 NOTE — Telephone Encounter (Signed)
Fax updated FMLA  

## 2022-07-15 ENCOUNTER — Telehealth: Payer: Self-pay

## 2022-07-15 ENCOUNTER — Inpatient Hospital Stay: Payer: BC Managed Care – PPO | Admitting: Gynecologic Oncology

## 2022-07-15 DIAGNOSIS — N891 Moderate vaginal dysplasia: Secondary | ICD-10-CM

## 2022-07-15 NOTE — Telephone Encounter (Signed)
Per Dr Berline Lopes contacted patient to see about rescheduling her appt for October 31 or anytime after today.  Left message.

## 2022-08-09 ENCOUNTER — Encounter: Payer: Self-pay | Admitting: Gynecologic Oncology

## 2022-08-09 ENCOUNTER — Other Ambulatory Visit: Payer: Self-pay

## 2022-08-09 ENCOUNTER — Inpatient Hospital Stay: Payer: BC Managed Care – PPO

## 2022-08-09 ENCOUNTER — Inpatient Hospital Stay: Payer: BC Managed Care – PPO | Attending: Gynecologic Oncology | Admitting: Gynecologic Oncology

## 2022-08-09 VITALS — BP 150/98 | HR 92 | Temp 99.1°F | Resp 18 | Ht 67.52 in | Wt 194.0 lb

## 2022-08-09 DIAGNOSIS — R300 Dysuria: Secondary | ICD-10-CM

## 2022-08-09 DIAGNOSIS — Z7189 Other specified counseling: Secondary | ICD-10-CM | POA: Diagnosis not present

## 2022-08-09 DIAGNOSIS — R350 Frequency of micturition: Secondary | ICD-10-CM | POA: Insufficient documentation

## 2022-08-09 DIAGNOSIS — N891 Moderate vaginal dysplasia: Secondary | ICD-10-CM

## 2022-08-09 LAB — URINALYSIS, COMPLETE (UACMP) WITH MICROSCOPIC
Bacteria, UA: NONE SEEN
Bilirubin Urine: NEGATIVE
Glucose, UA: NEGATIVE mg/dL
Hgb urine dipstick: NEGATIVE
Ketones, ur: NEGATIVE mg/dL
Leukocytes,Ua: NEGATIVE
Nitrite: NEGATIVE
Protein, ur: NEGATIVE mg/dL
Specific Gravity, Urine: 1.008 (ref 1.005–1.030)
pH: 6 (ref 5.0–8.0)

## 2022-08-09 NOTE — Progress Notes (Signed)
Gynecologic Oncology Return Clinic Visit  08/09/22  Reason for Visit: Follow-up  Treatment History: 05/04/22: Right partial vulvectomy, CO2 laser of the vulva   Interval History: Reports doing much better.  Vulva has significantly improved.  Denies any irritation or discharge.  Denies any bleeding.  Has had several days of increasing urinary frequency and dysuria.  Thinks that she may be developing a urine infection.  Also notes some lower and mid low back pain bilaterally, worse on the right.  Denies any fevers or chills.  Past Medical/Surgical History: Past Medical History:  Diagnosis Date   ADHD (attention deficit hyperactivity disorder)    Arrhythmia    rapid heart rate - wearing a cardiac monitor   Chronic low back pain    GERD (gastroesophageal reflux disease)    H/O: rheumatic fever    childhood   History of cervical dysplasia    History of condyloma acuminatum    genital area/ perinum/ anal   History of diverticulitis of colon    History of Helicobacter pylori infection 03/2019   treated   History of recurrent UTI (urinary tract infection)    History of vaginal dysplasia    Hypertension    MDD (major depressive disorder)    Palpitations    04-29-2022  intermittanly,  had event monitor done by cardiology 04-25-2022 results in care everywhere   Precordial pain    Renal artery stenosis (HCC) 10/2020   per imagaing in care everywhere 11-12-2020 bilateral renal artery stenosis <60%   Rheumatoid arthritis [facility])    rheumatology--- dr Virgel Manifold;  multiple sites   Vulvar dysplasia     Past Surgical History:  Procedure Laterality Date   APPENDECTOMY     1990s   CERVICAL BIOPSY  W/ LOOP ELECTRODE EXCISION  2003   @[facility]    CERVICAL CONIZATION W/BX  01/10/2022   @[facility] by dr n. 01/12/2022;   AND RE-EXCISION MULTIPLE CONDYLOMATA PERINEUM TO ANAL   CO2 LASER APPLICATION N/A 05/04/2022   Procedure: CO2 LASER APPLICATION VULVA;  Surgeon: Ralph Dowdy, MD;  Location: [facility];  Service: Gynecology;  Laterality: N/A;   DILATION AND CURETTAGE OF UTERUS     x2,for missed ab,  last one 1996   PLACEMENT OF BREAST IMPLANTS Bilateral 2013   TUBAL LIGATION Bilateral    yrs ago   VULVECTOMY N/A 05/04/2022   Procedure: VULVECTOMY;  Surgeon: 2014, MD;  Location: [facility];  Service: Gynecology;  Laterality: N/A;    Family History  Problem Relation Age of Onset   Breast cancer Maternal Grandmother    Colon cancer Paternal Uncle     Social History   Socioeconomic History   Marital status: Divorced    Spouse name: Not on file   Number of children: Not on file   Years of education: Not on file   Highest education level: Not on file  Occupational History   Not on file  Tobacco Use   Smoking status: Every Day    Packs/day: 0.50    Types: Cigarettes   Smokeless tobacco: Never  Vaping Use   Vaping Use: Never used  Substance and Sexual Activity   Alcohol use: Not Currently   Drug use: Never   Sexual activity: Yes    Birth control/protection: I.U.D., Surgical  Other Topics Concern   Not on file  Social History Narrative   Not on file   Social Determinants of Health   Financial Resource Strain: Not on file  Food Insecurity: Not on file  Transportation Needs: Not on file  Physical Activity: Not on file  Stress: Not on file  Social Connections: Not on file    Current Medications:  Current Outpatient Medications:    albuterol (VENTOLIN HFA) 108 (90 Base) MCG/ACT inhaler, Inhale 2 puffs into the lungs every 6 (six) hours as needed for shortness of breath or wheezing., Disp: , Rfl:    aspirin EC 81 MG tablet, Take 81 mg by mouth daily. Swallow whole., Disp: , Rfl:    cyanocobalamin ([employer] VITAMIN B12) 1000 MCG tablet, Place under the tongue., Disp: , Rfl:    ENBREL SURECLICK 50 MG/ML injection, Inject 50 mg into the skin once a week. Thursday's, Disp: , Rfl:    EPINEPHrine 0.3 mg/0.3 mL IJ SOAJ injection,  Inject 0.3 mg into the muscle as needed for anaphylaxis., Disp: , Rfl:    fluconazole (DIFLUCAN) 150 MG tablet, Take 1 tablet (150 mg total) by mouth daily., Disp: 1 tablet, Rfl: 0   folic acid (FOLVITE) 1 MG tablet, Take 2 mg by mouth daily., Disp: , Rfl:    furosemide (LASIX) 20 MG tablet, Take 20 mg by mouth daily., Disp: , Rfl:    ibuprofen (ADVIL) 800 MG tablet, Take 1 tablet by mouth every 6 (six) hours as needed., Disp: , Rfl:    ketoconazole (NIZORAL) 2 % shampoo, APPLY TOPICALLY TWICE A WEEK, Disp: , Rfl:    leflunomide (ARAVA) 20 MG tablet, Take 20 mg by mouth daily., Disp: , Rfl:    levonorgestrel (MIRENA) 20 MCG/DAY IUD, 1 each by Intrauterine route once., Disp: , Rfl:    lisinopril-hydrochlorothiazide (ZESTORETIC) 20-25 MG tablet, Take 1 tablet by mouth at bedtime., Disp: , Rfl:    loratadine (CLARITIN) 10 MG tablet, Take 10 mg by mouth daily., Disp: , Rfl:    metoprolol succinate (TOPROL-XL) 100 MG 24 hr tablet, Take 100 mg by mouth at bedtime., Disp: , Rfl:    omeprazole (PRILOSEC) 20 MG capsule, Take 20 mg by mouth daily., Disp: , Rfl:    oxyCODONE (ROXICODONE) 15 MG immediate release tablet, Take 15 mg by mouth every 4 (four) hours as needed for pain. For pain, Disp: , Rfl:    Prenatal Vit-Min-FA-Fish Oil ([employer] PRENATAL GUMMY PO), Take 1 tablet by mouth daily., Disp: , Rfl:    tiZANidine (ZANAFLEX) 4 MG tablet, Take 4 mg by mouth 2 (two) times daily as needed for muscle spasms., Disp: , Rfl:   Review of Systems: + Dysuria, frequency Denies appetite changes, fevers, chills, fatigue, unexplained weight changes. Denies hearing loss, neck lumps or masses, mouth sores, ringing in ears or voice changes. Denies cough or wheezing.  Denies shortness of breath. Denies chest pain or palpitations. Denies leg swelling. Denies abdominal distention, pain, blood in stools, constipation, diarrhea, nausea, vomiting, or early satiety. Denies pain with intercourse, hematuria or  incontinence. Denies hot flashes, pelvic pain, vaginal bleeding or vaginal discharge.   Denies joint pain, back pain or muscle pain/cramps. Denies itching, rash, or wounds. Denies dizziness, headaches, numbness or seizures. Denies swollen lymph nodes or glands, denies easy bruising or bleeding. Denies anxiety, depression, confusion, or decreased concentration.  Physical Exam: BP (!) 150/98 Comment: manual recheck, Dr. Pricilla Holm aware  Pulse 92   Temp 99.1 F (37.3 C) (Oral)   Resp 18   Ht 5' 7.52" (1.715 m)   Wt 194 lb (88 kg)   SpO2 96%   BMI 29.92 kg/m  BP recheck was 150/98 General: Alert,  oriented, no acute distress. HEENT: Normocephalic, atraumatic, sclera anicteric. Chest: Unlabored breathing on room air. Back: No CVA tenderness. Extremities: Grossly normal range of motion.  Warm, well perfused.  No edema bilaterally. GU: Normal appearing external genitalia without erythema, excoriation, or lesions.  Both the vulvectomy incision as well posterior fourchette, where laser was performed, have healed well.  No erythema or exudate.  Laboratory & Radiologic Studies: A. 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  - VIN-1 (koilocytic change) focally involves 3:00 margin.  - Remaining margins negative for dysplasia.  - Negative for invasive carcinoma.   B. LATERAL MARGIN, 9 TO 12 OCLOCK, EXCISION:  - Benign vulvar skin.  - Negative for dysplasia.   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   Patient is overall doing well postoperatively.  Incisions have healed completely.  Patient has a history of urinary tract infections in the setting of going long periods of time without urinating and drinking little fluid secondary to her job.  Given her symptoms, plan to send urinalysis and culture today.  I will call her with her urine results.   If no evidence of infection, we discussed trial of vaginal estrogen to see if this helps with symptoms and to prevent future urinary tract infections.   We discussed pathology from surgery again.  Patient was given a copy of her pathology report at her last visit.  Focal high-grade dysplasia was noted within the vulvectomy specimens, margins negative for high-grade dysplasia.  Focal area of the 3:00 margin had low-grade koilocytic changes.     In the setting of her vulvar dysplasia history, we discussed the need for long-term surveillance.  Initially, this will be visits every 6 months with her OB/GYN.  We reviewed signs and symptoms that should prompt a phone call sooner than her next scheduled visit.  18 minutes of total time was spent for this patient encounter, including preparation, face-to-face counseling with the patient and coordination of care, and documentation of the encounter.  Eugene Garnet, MD  Division of Gynecologic Oncology  Department of Obstetrics and Gynecology  [facility]

## 2022-08-09 NOTE — Patient Instructions (Signed)
It was good to see you today.  I will call you with your urine results.  If there is no evidence of infection, we can start vaginal estrogen.  Please make sure to call your OB/GYN to schedule a follow-up visit in 6 months.

## 2022-08-11 ENCOUNTER — Other Ambulatory Visit: Payer: Self-pay | Admitting: Gynecologic Oncology

## 2022-08-11 DIAGNOSIS — N952 Postmenopausal atrophic vaginitis: Secondary | ICD-10-CM

## 2022-08-11 DIAGNOSIS — R300 Dysuria: Secondary | ICD-10-CM

## 2022-08-11 LAB — URINE CULTURE: Culture: NO GROWTH

## 2022-08-11 MED ORDER — ESTRADIOL 0.1 MG/GM VA CREA: 1.0000 | TOPICAL_CREAM | VAGINAL | 12 refills | Status: AC

## 2023-12-04 ENCOUNTER — Encounter: Payer: Self-pay | Admitting: Cardiology
